# Patient Record
Sex: Male | Born: 1980 | Race: White | Hispanic: No | Marital: Single | Smoking: Never smoker
Health system: Southern US, Community
[De-identification: ages and names within clinical notes are randomized; demographics above are authoritative.]

## PROBLEM LIST (undated history)

## (undated) DIAGNOSIS — K219 Gastro-esophageal reflux disease without esophagitis: Secondary | ICD-10-CM

## (undated) DIAGNOSIS — I10 Essential (primary) hypertension: Secondary | ICD-10-CM

## (undated) DIAGNOSIS — F32A Depression, unspecified: Secondary | ICD-10-CM

## (undated) DIAGNOSIS — R7303 Prediabetes: Secondary | ICD-10-CM

## (undated) DIAGNOSIS — F329 Major depressive disorder, single episode, unspecified: Secondary | ICD-10-CM

## (undated) DIAGNOSIS — E119 Type 2 diabetes mellitus without complications: Secondary | ICD-10-CM

## (undated) DIAGNOSIS — G43909 Migraine, unspecified, not intractable, without status migrainosus: Secondary | ICD-10-CM

## (undated) DIAGNOSIS — N289 Disorder of kidney and ureter, unspecified: Secondary | ICD-10-CM

## (undated) DIAGNOSIS — F419 Anxiety disorder, unspecified: Secondary | ICD-10-CM

## (undated) HISTORY — PX: HERNIA REPAIR: SHX51

## (undated) HISTORY — DX: Migraine, unspecified, not intractable, without status migrainosus: G43.909

## (undated) HISTORY — PX: HAND SURGERY: SHX662

## (undated) HISTORY — PX: KNEE SURGERY: SHX244

---

## 2013-06-21 ENCOUNTER — Inpatient Hospital Stay (HOSPITAL_COMMUNITY)
Admission: EM | Admit: 2013-06-21 | Discharge: 2013-06-22 | DRG: 392 | Disposition: A | Discharge status: Home / Self Care | Payer: PRIVATE HEALTH INSURANCE | Attending: Family Medicine | Admitting: Family Medicine

## 2013-06-21 ENCOUNTER — Encounter (HOSPITAL_COMMUNITY): Payer: Self-pay | Admitting: Emergency Medicine

## 2013-06-21 DIAGNOSIS — R3129 Other microscopic hematuria: Secondary | ICD-10-CM | POA: Diagnosis present

## 2013-06-21 DIAGNOSIS — A088 Other specified intestinal infections: Principal | ICD-10-CM | POA: Diagnosis present

## 2013-06-21 DIAGNOSIS — E871 Hypo-osmolality and hyponatremia: Secondary | ICD-10-CM | POA: Diagnosis present

## 2013-06-21 DIAGNOSIS — R7309 Other abnormal glucose: Secondary | ICD-10-CM | POA: Diagnosis present

## 2013-06-21 DIAGNOSIS — R197 Diarrhea, unspecified: Secondary | ICD-10-CM | POA: Diagnosis present

## 2013-06-21 DIAGNOSIS — R112 Nausea with vomiting, unspecified: Secondary | ICD-10-CM | POA: Diagnosis present

## 2013-06-21 DIAGNOSIS — R109 Unspecified abdominal pain: Secondary | ICD-10-CM | POA: Diagnosis present

## 2013-06-21 DIAGNOSIS — R739 Hyperglycemia, unspecified: Secondary | ICD-10-CM | POA: Diagnosis present

## 2013-06-21 DIAGNOSIS — Z87442 Personal history of urinary calculi: Secondary | ICD-10-CM

## 2013-06-21 LAB — BASIC METABOLIC PANEL
BUN: 11 mg/dL (ref 6–23)
CO2: 25 mEq/L (ref 19–32)
Calcium: 8.4 mg/dL (ref 8.4–10.5)
Chloride: 98 mEq/L (ref 96–112)
Creatinine, Ser: 0.76 mg/dL (ref 0.50–1.35)
Glucose, Bld: 103 mg/dL — ABNORMAL HIGH (ref 70–99)
POTASSIUM: 3.7 meq/L (ref 3.7–5.3)
Sodium: 135 mEq/L — ABNORMAL LOW (ref 137–147)

## 2013-06-21 LAB — URINE MICROSCOPIC-ADD ON

## 2013-06-21 LAB — CBC WITH DIFFERENTIAL/PLATELET
BASOS ABS: 0 10*3/uL (ref 0.0–0.1)
BASOS PCT: 0 % (ref 0–1)
Eosinophils Absolute: 0 10*3/uL (ref 0.0–0.7)
Eosinophils Relative: 0 % (ref 0–5)
HCT: 40 % (ref 39.0–52.0)
Hemoglobin: 14.3 g/dL (ref 13.0–17.0)
LYMPHS PCT: 8 % — AB (ref 12–46)
Lymphs Abs: 0.7 10*3/uL (ref 0.7–4.0)
MCH: 32.4 pg (ref 26.0–34.0)
MCHC: 35.8 g/dL (ref 30.0–36.0)
MCV: 90.5 fL (ref 78.0–100.0)
Monocytes Absolute: 0.7 10*3/uL (ref 0.1–1.0)
Monocytes Relative: 8 % (ref 3–12)
NEUTROS ABS: 7.1 10*3/uL (ref 1.7–7.7)
NEUTROS PCT: 84 % — AB (ref 43–77)
Platelets: 181 10*3/uL (ref 150–400)
RBC: 4.42 MIL/uL (ref 4.22–5.81)
RDW: 12.4 % (ref 11.5–15.5)
WBC: 8.5 10*3/uL (ref 4.0–10.5)

## 2013-06-21 LAB — HEPATIC FUNCTION PANEL
ALK PHOS: 78 U/L (ref 39–117)
ALT: 14 U/L (ref 0–53)
AST: 13 U/L (ref 0–37)
Albumin: 3.6 g/dL (ref 3.5–5.2)
Bilirubin, Direct: <0.2 mg/dL (ref 0.0–0.3)
TOTAL PROTEIN: 7.4 g/dL (ref 6.0–8.3)
Total Bilirubin: 1 mg/dL (ref 0.3–1.2)

## 2013-06-21 LAB — URINALYSIS, ROUTINE W REFLEX MICROSCOPIC
Bilirubin Urine: NEGATIVE
Glucose, UA: NEGATIVE mg/dL
Ketones, ur: NEGATIVE mg/dL
LEUKOCYTES UA: NEGATIVE
NITRITE: NEGATIVE
PROTEIN: NEGATIVE mg/dL
Specific Gravity, Urine: 1.025 (ref 1.005–1.030)
Urobilinogen, UA: 0.2 mg/dL (ref 0.0–1.0)
pH: 5.5 (ref 5.0–8.0)

## 2013-06-21 LAB — LIPASE, BLOOD: LIPASE: 22 U/L (ref 11–59)

## 2013-06-21 MED ORDER — ONDANSETRON HCL 4 MG/2ML IJ SOLN
4.0000 mg | Freq: Once | INTRAMUSCULAR | Status: AC
  Administered 2013-06-21: 4 mg via INTRAVENOUS
  Filled 2013-06-21: qty 2

## 2013-06-21 MED ORDER — HYDROMORPHONE HCL PF 1 MG/ML IJ SOLN
1.0000 mg | Freq: Once | INTRAMUSCULAR | Status: AC
  Administered 2013-06-21: 1 mg via INTRAVENOUS

## 2013-06-21 MED ORDER — HYDROMORPHONE HCL PF 1 MG/ML IJ SOLN
INTRAMUSCULAR | Status: AC
  Filled 2013-06-21: qty 1

## 2013-06-21 MED ORDER — HYDROMORPHONE HCL PF 1 MG/ML IJ SOLN
1.0000 mg | Freq: Once | INTRAMUSCULAR | Status: AC
  Administered 2013-06-21: 1 mg via INTRAVENOUS
  Filled 2013-06-21: qty 1

## 2013-06-21 NOTE — ED Provider Notes (Addendum)
CSN: [id number]     Arrival date & time 06/21/13  1813 History  This chart was scribed for Maudry Diego, MD by Rolanda Lundborg, ED Scribe. This patient was seen in room APA04/APA04 and the patient's care was started at 7:49 PM.    Chief Complaint  Patient presents with  . Abdominal Pain   Patient is a 33 y.o. male presenting with abdominal pain. The history is provided by the patient. No language interpreter was used.  Abdominal Pain Pain location:  RLQ Pain radiates to:  Does not radiate Pain severity:  Moderate Onset quality:  Gradual Duration:  5 hours Timing:  Constant Associated symptoms: nausea and vomiting   Associated symptoms: no chest pain, no cough, no diarrhea, no dysuria, no fatigue and no hematuria    HPI Comments: Matthew Vance is a 33 y.o. male who presents to the Emergency Department complaining of RLQ abdominal pain onset this afternoon with nausea and vomiting. He states the vomiting started at 1PM and the pain started at [facility]. He has not taken anything for the pain. He has not had anything to eat or drink in 4 hours. He denies h/o similar pain, abdominal surgeries. He denies back pain, dysuria.  PCP - Gar Ponto  History reviewed. No pertinent past medical history. History reviewed. No pertinent past surgical history. No family history on file. History  Substance Use Topics  . Smoking status: Never Smoker   . Smokeless tobacco: Not on file  . Alcohol Use: Yes    Review of Systems  Constitutional: Negative for appetite change and fatigue.  HENT: Negative for congestion, ear discharge and sinus pressure.   Eyes: Negative for discharge.  Respiratory: Negative for cough.   Cardiovascular: Negative for chest pain.  Gastrointestinal: Positive for nausea, vomiting and abdominal pain. Negative for diarrhea.  Genitourinary: Negative for dysuria, frequency and hematuria.  Musculoskeletal: Negative for back pain.  Skin: Negative for rash.  Neurological: Negative  for seizures and headaches.  Psychiatric/Behavioral: Negative for hallucinations.      Allergies  Review of patient's allergies indicates no known allergies.  Home Medications   Current Outpatient Rx  Name  Route  Sig  Dispense  Refill  . FLUoxetine (PROZAC) 20 MG capsule   Oral   Take 20 mg by mouth at bedtime.         . TRAZODONE HCL PO   Oral   Take 2 tablets by mouth at bedtime.          BP 142/91  Pulse 64  Temp(Src) 99.1 F (37.3 C) (Oral)  Resp 20  Ht 6\' 4"  (1.93 m)  Wt 290 lb (131.543 kg)  BMI 35.31 kg/m2  SpO2 95% Physical Exam  Constitutional: He is oriented to person, place, and time. He appears well-developed.  HENT:  Head: Normocephalic.  Eyes: Conjunctivae and EOM are normal. No scleral icterus.  Neck: Neck supple. No thyromegaly present.  Cardiovascular: Normal rate and regular rhythm.  Exam reveals no gallop and no friction rub.   No murmur heard. Pulmonary/Chest: No stridor. He has no wheezes. He has no rales. He exhibits no tenderness.  Abdominal: He exhibits no distension. There is tenderness (moderate RLQ and suprapubic). There is no rebound.  Musculoskeletal: Normal range of motion. He exhibits no edema.  Lymphadenopathy:    He has no cervical adenopathy.  Neurological: He is oriented to person, place, and time. He exhibits normal muscle tone. Coordination normal.  Skin: No rash noted. No erythema.  Psychiatric: He  has a normal mood and affect. His behavior is normal.    ED Course  Procedures (including critical care time) Medications  HYDROmorphone (DILAUDID) injection 1 mg (1 mg Intravenous Given 06/21/13 2005)  ondansetron (ZOFRAN) injection 4 mg (4 mg Intravenous Given 06/21/13 2005)    DIAGNOSTIC STUDIES: Oxygen Saturation is 95% on RA, normal by my interpretation.    COORDINATION OF CARE: 7:53 PM- Discussed treatment plan with pt which includes UA, blood work, pain management. Pt agrees to plan.    Labs Review Labs Reviewed   BASIC METABOLIC PANEL - Abnormal; Notable for the following:    Sodium 135 (*)    Glucose, Bld 103 (*)    All other components within normal limits  CBC WITH DIFFERENTIAL - Abnormal; Notable for the following:    Neutrophils Relative % 84 (*)    Lymphocytes Relative 8 (*)    All other components within normal limits  LIPASE, BLOOD  HEPATIC FUNCTION PANEL  URINALYSIS, ROUTINE W REFLEX MICROSCOPIC   Imaging Review No results found.   EKG Interpretation None      MDM   Final diagnoses:  None  abdominal pain.  Unknown etiology.  Cbc, cmet, u/a neg.  Ct abd at [facility] today at 1pm normal.  Pt to follow up with pcp tomorrow.  The chart was scribed for me under my direct supervision.  I personally performed the history, physical, and medical decision making and all procedures in the evaluation of this patient.Maudry Diego, MD 06/21/13 2139  Maudry Diego, MD 06/21/13 2230                                         The pt refused discharge.   The hospitalist will obs admit the pt The chart was scribed for me under my direct supervision.  I personally performed the history, physical, and medical decision making and all procedures in the evaluation of this patient.Maudry Diego, MD 06/21/13 [phone]

## 2013-06-21 NOTE — Discharge Instructions (Signed)
Follow up with your md tomorrow for recheck.  Take fluids only

## 2013-06-21 NOTE — ED Notes (Signed)
Lower right quadrant pain with nausea and vomiting

## 2013-06-22 ENCOUNTER — Encounter (HOSPITAL_COMMUNITY): Payer: Self-pay | Admitting: *Deleted

## 2013-06-22 DIAGNOSIS — R739 Hyperglycemia, unspecified: Secondary | ICD-10-CM | POA: Diagnosis present

## 2013-06-22 DIAGNOSIS — R109 Unspecified abdominal pain: Secondary | ICD-10-CM

## 2013-06-22 DIAGNOSIS — R1031 Right lower quadrant pain: Secondary | ICD-10-CM | POA: Diagnosis not present

## 2013-06-22 DIAGNOSIS — R197 Diarrhea, unspecified: Secondary | ICD-10-CM | POA: Diagnosis present

## 2013-06-22 DIAGNOSIS — A088 Other specified intestinal infections: Secondary | ICD-10-CM | POA: Diagnosis not present

## 2013-06-22 DIAGNOSIS — R3129 Other microscopic hematuria: Secondary | ICD-10-CM | POA: Diagnosis present

## 2013-06-22 DIAGNOSIS — E871 Hypo-osmolality and hyponatremia: Secondary | ICD-10-CM | POA: Diagnosis present

## 2013-06-22 DIAGNOSIS — R112 Nausea with vomiting, unspecified: Secondary | ICD-10-CM

## 2013-06-22 DIAGNOSIS — R7309 Other abnormal glucose: Secondary | ICD-10-CM | POA: Diagnosis present

## 2013-06-22 DIAGNOSIS — Z87442 Personal history of urinary calculi: Secondary | ICD-10-CM | POA: Diagnosis not present

## 2013-06-22 LAB — COMPREHENSIVE METABOLIC PANEL
ALT: 13 U/L (ref 0–53)
AST: 14 U/L (ref 0–37)
Albumin: 3.4 g/dL — ABNORMAL LOW (ref 3.5–5.2)
Alkaline Phosphatase: 73 U/L (ref 39–117)
BUN: 12 mg/dL (ref 6–23)
CO2: 24 meq/L (ref 19–32)
Calcium: 8.2 mg/dL — ABNORMAL LOW (ref 8.4–10.5)
Chloride: 101 mEq/L (ref 96–112)
Creatinine, Ser: 0.76 mg/dL (ref 0.50–1.35)
GFR calc Af Amer: >90 mL/min (ref >90)
Glucose, Bld: 111 mg/dL — ABNORMAL HIGH (ref 70–99)
Potassium: 3.8 mEq/L (ref 3.7–5.3)
Sodium: 136 mEq/L — ABNORMAL LOW (ref 137–147)
Total Bilirubin: 0.8 mg/dL (ref 0.3–1.2)
Total Protein: 7 g/dL (ref 6.0–8.3)

## 2013-06-22 LAB — HEMOGLOBIN A1C
Hgb A1c MFr Bld: 5.1 % (ref <5.7)
MEAN PLASMA GLUCOSE: 100 mg/dL (ref <117)

## 2013-06-22 LAB — CBC WITH DIFFERENTIAL/PLATELET
Basophils Absolute: 0 10*3/uL (ref 0.0–0.1)
Basophils Relative: 0 % (ref 0–1)
Eosinophils Absolute: 0 10*3/uL (ref 0.0–0.7)
Eosinophils Relative: 1 % (ref 0–5)
HCT: 38.6 % — ABNORMAL LOW (ref 39.0–52.0)
Hemoglobin: 13.4 g/dL (ref 13.0–17.0)
LYMPHS ABS: 0.9 10*3/uL (ref 0.7–4.0)
Lymphocytes Relative: 16 % (ref 12–46)
MCH: 31.7 pg (ref 26.0–34.0)
MCHC: 34.7 g/dL (ref 30.0–36.0)
MCV: 91.3 fL (ref 78.0–100.0)
MONOS PCT: 12 % (ref 3–12)
Monocytes Absolute: 0.7 10*3/uL (ref 0.1–1.0)
NEUTROS ABS: 4 10*3/uL (ref 1.7–7.7)
NEUTROS PCT: 72 % (ref 43–77)
Platelets: 165 10*3/uL (ref 150–400)
RBC: 4.23 MIL/uL (ref 4.22–5.81)
RDW: 12.6 % (ref 11.5–15.5)
WBC: 5.6 10*3/uL (ref 4.0–10.5)

## 2013-06-22 LAB — CLOSTRIDIUM DIFFICILE BY PCR: CDIFFPCR: NEGATIVE

## 2013-06-22 MED ORDER — CIPROFLOXACIN IN D5W 400 MG/200ML IV SOLN
400.0000 mg | Freq: Two times a day (BID) | INTRAVENOUS | Status: DC
  Administered 2013-06-22: 400 mg via INTRAVENOUS
  Filled 2013-06-22: qty 200

## 2013-06-22 MED ORDER — ONDANSETRON HCL 4 MG/2ML IJ SOLN: 4.0000 mg | Freq: Four times a day (QID) | INTRAMUSCULAR | Status: DC | PRN

## 2013-06-22 MED ORDER — FLUOXETINE HCL 20 MG PO CAPS
20.0000 mg | ORAL_CAPSULE | Freq: Every day | ORAL | Status: DC
  Administered 2013-06-22: 20 mg via ORAL
  Filled 2013-06-22 (×3): qty 1

## 2013-06-22 MED ORDER — SODIUM CHLORIDE 0.9 % IV SOLN
INTRAVENOUS | Status: DC
  Administered 2013-06-22: 09:00:00 via INTRAVENOUS

## 2013-06-22 MED ORDER — HYDROMORPHONE HCL PF 1 MG/ML IJ SOLN
1.0000 mg | INTRAMUSCULAR | Status: DC | PRN
  Administered 2013-06-22: 1 mg via INTRAVENOUS
  Filled 2013-06-22: qty 1

## 2013-06-22 MED ORDER — HYDROMORPHONE HCL PF 1 MG/ML IJ SOLN
INTRAMUSCULAR | Status: AC
  Administered 2013-06-22: 1 mg
  Filled 2013-06-22: qty 1

## 2013-06-22 MED ORDER — ONDANSETRON HCL 4 MG PO TABS: 4.0000 mg | ORAL_TABLET | ORAL | Status: DC | PRN

## 2013-06-22 MED ORDER — HYDROMORPHONE HCL PF 1 MG/ML IJ SOLN
1.0000 mg | INTRAMUSCULAR | Status: DC | PRN
  Administered 2013-06-22 (×3): 1 mg via INTRAVENOUS
  Filled 2013-06-22 (×4): qty 1

## 2013-06-22 MED ORDER — BOOST / RESOURCE BREEZE PO LIQD
1.0000 | Freq: Two times a day (BID) | ORAL | Status: DC
  Administered 2013-06-22: 1 via ORAL

## 2013-06-22 MED ORDER — HEPARIN SODIUM (PORCINE) 5000 UNIT/ML IJ SOLN
5000.0000 [IU] | Freq: Three times a day (TID) | INTRAMUSCULAR | Status: DC
  Administered 2013-06-22 (×3): 5000 [IU] via SUBCUTANEOUS
  Filled 2013-06-22 (×4): qty 1

## 2013-06-22 MED ORDER — ONDANSETRON HCL 4 MG/2ML IJ SOLN
4.0000 mg | INTRAMUSCULAR | Status: DC | PRN
  Administered 2013-06-22: 4 mg via INTRAVENOUS
  Filled 2013-06-22: qty 2

## 2013-06-22 MED ORDER — ONDANSETRON HCL 4 MG/2ML IJ SOLN
4.0000 mg | Freq: Four times a day (QID) | INTRAMUSCULAR | Status: DC | PRN
  Administered 2013-06-22: 4 mg via INTRAVENOUS
  Filled 2013-06-22: qty 2

## 2013-06-22 MED ORDER — TRAZODONE HCL 50 MG PO TABS
50.0000 mg | ORAL_TABLET | Freq: Every day | ORAL | Status: DC
  Administered 2013-06-22: 50 mg via ORAL
  Filled 2013-06-22: qty 1

## 2013-06-22 MED ORDER — ONDANSETRON HCL 4 MG PO TABS: 4.0000 mg | ORAL_TABLET | Freq: Four times a day (QID) | ORAL | Status: DC | PRN

## 2013-06-22 MED ORDER — PROMETHAZINE HCL 12.5 MG PO TABS: 12.5000 mg | ORAL_TABLET | Freq: Two times a day (BID) | ORAL | Status: DC | PRN

## 2013-06-22 NOTE — Progress Notes (Signed)
Late entry:  Patient reported feeling itchy after Cipro infusion.  Some redness to back and chest.  No difficult breathing.  VS see flowsheet.  Dyanne Carrel, NP notified.

## 2013-06-22 NOTE — Discharge Summary (Signed)
Physician Discharge Summary  Braxon Suder LPF:[id number] DOB: 1981-03-17 DOA: 06/21/2013  PCP: Gar Ponto, MD  Admit date: 06/21/2013 Discharge date: 06/22/2013  Recommendations for Outpatient Follow-up:  1. Resolution of abdominal pain, vomiting, diarrhea.   Follow-up Information   Follow up with Gar Ponto, MD In 1 week.   Specialty:  Family Medicine   Contact information:   [place] [place] [address] [phone]      Discharge Diagnoses:  1. Right lower quadrant pain of unclear etiology, likely related to viral gastroenteritis. 2. Nausea, vomiting, diarrhea, suspected viral gastroenteritis.  Discharge Condition: Improved Disposition: Home  Diet recommendation: Regular  Filed Weights   06/21/13 1839 06/22/13 0058  Weight: 131.543 kg (290 lb) 141.2 kg (311 lb 4.6 oz)    History of present illness:  33 year old man with no significant past medical history, was seen in the ER at [facility] 3/5 and according to record review BUN, creatinine, hemoglobin, hematocrit were unremarkable, normal platelet count. White blood cell count 13.2. Urinalysis was unremarkable. CT of the abdomen and pelvis with contrast report: No urolithiasis or obstructive uropathy. Normal appendix. Small fat containing umbilical hernia. He presented later the same day to [facility] ED with complaints of persistent right lower quadrant pain at which time basic metabolic panel, hepatic function panel, CBC, urinalysis were unremarkable. C. difficile was negative. Discharge home was recommended but the patient refused and therefore observation was requested.  Hospital Course:  Patient was observed overnight, hydrated with IV fluids and treated with supportive care. He is seen in consultation with surgery.  Right lower quadrant abdominal pain has nearly resolved at this point. Negative CT of the abdomen and pelvis at outside facility, no evidence of intra-abdominal infection or nephrolithiasis. Laboratory workup  unremarkable.   He has been seen in consultation by general surgery, impression was right lower quadrant pain of unclear etiology, consider typhlitis. Empiric ciprofloxacin was recommended. Appendicitis or other surgical problem was doubted.  Patient has no fever, no leukopenia, no neutropenic and no findings on CT. Complete metabolic panel unremarkable. He has received no antibiotics until late this afternoon, prior to that white blood cell count was normal and again he has been afebrile. Stool studies are pending but there is no evidence to suggest bacterial infection at this point. Plan to discontinue antibiotics.   Plan discharge home. I gave him strict instructions to return to emergency Department or see his primary care physician for recurrent symptoms. He and his mother understand the results of all his laboratory testing and imaging and that no significant acute abnormalities have been discovered at this time but that illness can evolve over time.  Discharge Instructions  Discharge Orders   Future Orders Complete By Expires   Activity as tolerated - No restrictions  As directed    Diet general  As directed    Discharge instructions  As directed    Comments:     Call your physician or seek immediate medical assistance for fever, recurrent abdominal pain, vomiting or diarrhea.       Medication List         FLUoxetine 20 MG capsule  Commonly known as:  PROZAC  Take 20 mg by mouth at bedtime.     TRAZODONE HCL PO  Take 2 tablets by mouth at bedtime.       No Known Allergies  The results of significant diagnostics from this hospitalization (including imaging, microbiology, ancillary and laboratory) are listed below for reference.    Significant Diagnostic  Studies: No results found.  Microbiology: Recent Results (from the past 240 hour(s))  CLOSTRIDIUM DIFFICILE BY PCR     Status: None   Collection Time    06/22/13  1:30 PM      Result Value Ref Range Status   C  difficile by pcr NEGATIVE  NEGATIVE Final     Labs: Basic Metabolic Panel:  Recent Labs Lab 06/21/13 1936 06/22/13 0515  NA 135* 136*  K 3.7 3.8  CL 98 101  CO2 25 24  GLUCOSE 103* 111*  BUN 11 12  CREATININE 0.76 0.76  CALCIUM 8.4 8.2*   Liver Function Tests:  Recent Labs Lab 06/21/13 1930 06/22/13 0515  AST 13 14  ALT 14 13  ALKPHOS 78 73  BILITOT 1.0 0.8  PROT 7.4 7.0  ALBUMIN 3.6 3.4*    Recent Labs Lab 06/21/13 1930  LIPASE 22   CBC:  Recent Labs Lab 06/21/13 1936 06/22/13 0515  WBC 8.5 5.6  NEUTROABS 7.1 4.0  HGB 14.3 13.4  HCT 40.0 38.6*  MCV 90.5 91.3  PLT 181 165    Active Problems:   Abdominal pain   Hyponatremia   Hyperglycemia   Nausea with vomiting   Hematuria, microscopic   Diarrhea   Time coordinating discharge: 35 minutes  Signed:  Murray Hodgkins, MD [facility] 06/22/2013, 5:37 PM

## 2013-06-22 NOTE — Progress Notes (Signed)
Patient seen, independently examined and chart reviewed. I agree with exam, assessment and plan discussed with Dyanne Carrel, NP.  33 year old man with no significant past medical history, was seen in the ER at [facility] 3/5 and according to record review BUN, creatinine, hemoglobin, hematocrit were unremarkable, normal platelet count. White blood cell count 13.2. Urinalysis was unremarkable. CT of the abdomen and pelvis with contrast report: No urolithiasis or obstructive uropathy. Normal appendix. Small fat containing umbilical hernia.  He presented later the same day to [facility] ED with complaints of persistent right lower quadrant pain at which time basic metabolic panel, hepatic function panel, CBC, urinalysis were unremarkable. C. difficile was negative. Discharge home was recommended but the patient refused and therefore observation was requested.  Subjective: He feels remarkably better now. He has minimal right lower quadrant pain. No nausea, vomiting or diarrhea now. Tolerating liquids. He strongly requests to be discharged home. Mother at bedside agrees he is much improved now and like to be discharged.  Objective: Afebrile, vital signs are stable. No hypoxia. He appears calm and comfortable. Well-appearing. Moves without apparent pain. Cardiovascular regular rate and rhythm. Respiratory clear to auscultation bilaterally. No wheezes, rales or rhonchi. Normal respiratory effort. Abdomen soft, nondistended. He has modest right lower quadrant pain with palpation without rebound.  Complete metabolic panel unremarkable. CBC unremarkable with normal white blood cell count and differential.  Assessment/plan Right lower quadrant abdominal pain. Negative CT of the abdomen and pelvis at outside facility, no evidence of intra-abdominal infection or nephrolithiasis. Laboratory workup unremarkable.  He has been seen in consultation by general surgery, impression was right lower quadrant pain of unclear  etiology, consider typhlitis. Empiric ciprofloxacin was recommended. Appendicitis or other surgical problem was gout at.  Patient has no fever, no leukopenia, no neutropenic and no findings on CT. Complete metabolic panel unremarkable. He has received no antibiotics until late this afternoon, prior to that white blood cell count was normal and again he has been afebrile. Stool studies are pending but there is no evidence to suggest bacterial infection at this point. Plan to discontinue antibiotics.  Plan discharge home. I gave him strict instructions to return to emergency Department or see his primary care physician for recurrent symptoms. He and his mother understand the results of all his laboratory testing and imaging and that no acute abnormalities have been discovered at this time but that illness can evolve over time.  Murray Hodgkins, MD [facility] [phone]

## 2013-06-22 NOTE — Progress Notes (Signed)
Late Entry 7741:  Patient complained of nausea.  Zofran PRN not due until later this afternoon.  Dyanne Carrel, NP notified via text page.

## 2013-06-22 NOTE — Consult Note (Signed)
Reason for Consult: Right lower quadrant abdominal pain Referring Physician: [facility]  Matthew Vance is an 33 y.o. male.  HPI: Patient is a 33 year old white male who presented to emergency room at [facility] with right lower quadrant abdominal pain. Workup including CT scan the abdomen was unremarkable. He did not have evidence of acute appendicitis. Due to persistence of the pain, he then presented to [facility]. He states that he has started to have diarrhea and loose watery stools. 2 of his children have had similar abdominal pain and diarrhea. He denies any fever chills. He has never had an episode like this before.  History reviewed. No pertinent past medical history.  History reviewed. No pertinent past surgical history.  No family history on file.  Social History:  reports that he has never smoked. He does not have any smokeless tobacco history on file. He reports that he drinks alcohol. His drug history is not on file.  Allergies: No Known Allergies  Medications: I have reviewed the patient's current medications.  Results for orders placed during the hospital encounter of 06/21/13 (from the past 48 hour(s))  LIPASE, BLOOD     Status: None   Collection Time    06/21/13  7:30 PM      Result Value Ref Range   Lipase 22  11 - 59 U/L  HEPATIC FUNCTION PANEL     Status: None   Collection Time    06/21/13  7:30 PM      Result Value Ref Range   Total Protein 7.4  6.0 - 8.3 g/dL   Albumin 3.6  3.5 - 5.2 g/dL   AST 13  0 - 37 U/L   ALT 14  0 - 53 U/L   Alkaline Phosphatase 78  39 - 117 U/L   Total Bilirubin 1.0  0.3 - 1.2 mg/dL   Bilirubin, Direct <0.2  0.0 - 0.3 mg/dL   Indirect Bilirubin NOT CALCULATED  0.3 - 0.9 mg/dL  BASIC METABOLIC PANEL     Status: Abnormal   Collection Time    06/21/13  7:36 PM      Result Value Ref Range   Sodium 135 (*) 137 - 147 mEq/L   Potassium 3.7  3.7 - 5.3 mEq/L   Chloride 98  96 - 112 mEq/L   CO2 25  19 -  32 mEq/L   Glucose, Bld 103 (*) 70 - 99 mg/dL   BUN 11  6 - 23 mg/dL   Creatinine, Ser 0.76  0.50 - 1.35 mg/dL   Calcium 8.4  8.4 - 10.5 mg/dL   GFR calc non Af Amer >90  >90 mL/min   GFR calc Af Amer >90  >90 mL/min   Comment: (NOTE)     The eGFR has been calculated using the CKD EPI equation.     This calculation has not been validated in all clinical situations.     eGFR's persistently <90 mL/min signify possible Chronic Kidney     Disease.  CBC WITH DIFFERENTIAL     Status: Abnormal   Collection Time    06/21/13  7:36 PM      Result Value Ref Range   WBC 8.5  4.0 - 10.5 K/uL   RBC 4.42  4.22 - 5.81 MIL/uL   Hemoglobin 14.3  13.0 - 17.0 g/dL   HCT 40.0  39.0 - 52.0 %   MCV 90.5  78.0 - 100.0 fL   MCH 32.4  26.0 - 34.0 pg  MCHC 35.8  30.0 - 36.0 g/dL   RDW 12.4  11.5 - 15.5 %   Platelets 181  150 - 400 K/uL   Neutrophils Relative % 84 (*) 43 - 77 %   Neutro Abs 7.1  1.7 - 7.7 K/uL   Lymphocytes Relative 8 (*) 12 - 46 %   Lymphs Abs 0.7  0.7 - 4.0 K/uL   Monocytes Relative 8  3 - 12 %   Monocytes Absolute 0.7  0.1 - 1.0 K/uL   Eosinophils Relative 0  0 - 5 %   Eosinophils Absolute 0.0  0.0 - 0.7 K/uL   Basophils Relative 0  0 - 1 %   Basophils Absolute 0.0  0.0 - 0.1 K/uL  URINALYSIS, ROUTINE W REFLEX MICROSCOPIC     Status: Abnormal   Collection Time    06/21/13  8:10 PM      Result Value Ref Range   Color, Urine YELLOW  YELLOW   APPearance CLEAR  CLEAR   Specific Gravity, Urine 1.025  1.005 - 1.030   pH 5.5  5.0 - 8.0   Glucose, UA NEGATIVE  NEGATIVE mg/dL   Hgb urine dipstick TRACE (*) NEGATIVE   Bilirubin Urine NEGATIVE  NEGATIVE   Ketones, ur NEGATIVE  NEGATIVE mg/dL   Protein, ur NEGATIVE  NEGATIVE mg/dL   Urobilinogen, UA 0.2  0.0 - 1.0 mg/dL   Nitrite NEGATIVE  NEGATIVE   Leukocytes, UA NEGATIVE  NEGATIVE  URINE MICROSCOPIC-ADD ON     Status: None   Collection Time    06/21/13  8:10 PM      Result Value Ref Range   Squamous Epithelial / LPF RARE  RARE    WBC, UA 0-2  <3 WBC/hpf   RBC / HPF 0-2  <3 RBC/hpf   Bacteria, UA RARE  RARE  COMPREHENSIVE METABOLIC PANEL     Status: Abnormal   Collection Time    06/22/13  5:15 AM      Result Value Ref Range   Sodium 136 (*) 137 - 147 mEq/L   Potassium 3.8  3.7 - 5.3 mEq/L   Chloride 101  96 - 112 mEq/L   CO2 24  19 - 32 mEq/L   Glucose, Bld 111 (*) 70 - 99 mg/dL   BUN 12  6 - 23 mg/dL   Creatinine, Ser 0.76  0.50 - 1.35 mg/dL   Calcium 8.2 (*) 8.4 - 10.5 mg/dL   Total Protein 7.0  6.0 - 8.3 g/dL   Albumin 3.4 (*) 3.5 - 5.2 g/dL   AST 14  0 - 37 U/L   ALT 13  0 - 53 U/L   Alkaline Phosphatase 73  39 - 117 U/L   Total Bilirubin 0.8  0.3 - 1.2 mg/dL   GFR calc non Af Amer >90  >90 mL/min   GFR calc Af Amer >90  >90 mL/min   Comment: (NOTE)     The eGFR has been calculated using the CKD EPI equation.     This calculation has not been validated in all clinical situations.     eGFR's persistently <90 mL/min signify possible Chronic Kidney     Disease.  CBC WITH DIFFERENTIAL     Status: Abnormal   Collection Time    06/22/13  5:15 AM      Result Value Ref Range   WBC 5.6  4.0 - 10.5 K/uL   RBC 4.23  4.22 - 5.81 MIL/uL   Hemoglobin 13.4  13.0 -  17.0 g/dL   HCT 38.6 (*) 39.0 - 52.0 %   MCV 91.3  78.0 - 100.0 fL   MCH 31.7  26.0 - 34.0 pg   MCHC 34.7  30.0 - 36.0 g/dL   RDW 12.6  11.5 - 15.5 %   Platelets 165  150 - 400 K/uL   Neutrophils Relative % 72  43 - 77 %   Neutro Abs 4.0  1.7 - 7.7 K/uL   Lymphocytes Relative 16  12 - 46 %   Lymphs Abs 0.9  0.7 - 4.0 K/uL   Monocytes Relative 12  3 - 12 %   Monocytes Absolute 0.7  0.1 - 1.0 K/uL   Eosinophils Relative 1  0 - 5 %   Eosinophils Absolute 0.0  0.0 - 0.7 K/uL   Basophils Relative 0  0 - 1 %   Basophils Absolute 0.0  0.0 - 0.1 K/uL    No results found.  ROS: See chart Blood pressure 135/89, pulse 65, temperature 97.8 F (36.6 C), temperature source Oral, resp. rate 20, height 6' 4"  (1.93 m), weight 141.2 kg (311 lb 4.6 oz),  SpO2 96.00%. Physical Exam: Obese white male who states he feels nauseated. Abdomen was soft and not rigid. He does have right lower quadrant abdominal pain as well as right upper quadrant abdominal pain to palpation. No hepatosplenomegaly or masses are noted. Rectal examination was deferred at this time.  Assessment/Plan: Impression: Right lower quadrant abdominal pain of unknown etiology, favor infectious source of pain such as typhlitis. Plan: Would start patient on ciprofloxacin once stool cultures obtained. No need for acute surgical intervention at this time. Will continue to follow with you. Doubt he has appendicitis or other surgical problem at this time.  Loneta Tamplin A 06/22/2013, 9:10 AM

## 2013-06-22 NOTE — Progress Notes (Signed)
[facility] PROGRESS NOTE  Matthew Vance ZOX:[id number] DOB: April 30, 1980 DOA: 06/21/2013 PCP: Gar Ponto, MD  Assessment/Plan: 1. Abdominal pain: RLQ. Lab work and CT from yesterday at [facility] reviewed and unremarkable. Pain persists. Evaluated by general surgery who favors infectious source. Surgery doubts appendicitis or other surgical problem at this time. He is afebrile a normal white count. Has developed loose stool this am. Reports family members with same. Will obtain stool studies and then start cipro per surgery recommendation. Provide pain medication as needed.  2. Diarrhea: see above. Will obtain stool studies. Monitor electrolytes. Continue clear liquid diet and IV fluids.  3. Nausea/vomiting. Likely related to #1. Will continue clear liquid. Provide zofran as needed. Monitor electrolytes 4. Hyperglycemia: mild. Will check a1c. May need OP follow up. No hx DM 5. Hematuria: microscopic. ?hx kidney stones. Urine output adequate. No flank tenderness. OP follow  Code Status: full Family Communication: mother at bedside Disposition Plan: home hopefully 24-48 hours   Consultants:  General surgery  Procedures:  none  Antibiotics:  cipro 06/22/13>>>  HPI/Subjective: Sitting up in bed visiting with family. Reports continued RLQ "tenderness". Reports nausea and some vomiting after drinking coke.   Objective: Filed Vitals:   06/22/13 0449  BP: 135/89  Pulse: 65  Temp: 97.8 F (36.6 C)  Resp: 20    Intake/Output Summary (Last 24 hours) at 06/22/13 1102 Last data filed at 06/22/13 0932  Gross per 24 hour  Intake  512.5 ml  Output    600 ml  Net  -87.5 ml   Filed Weights   06/21/13 1839 06/22/13 0058  Weight: 131.543 kg (290 lb) 141.2 kg (311 lb 4.6 oz)    Exam:   General:  Obese appears comfortable  Cardiovascular: RRR no m/g/r No LE edema  Respiratory: BS clear to ausculation bilaterally no wheeze  Abdomen: obese soft +BS moderate  tenderness RLQ.   Musculoskeletal: good muscle tone   Data Reviewed: Basic Metabolic Panel:  Recent Labs Lab 06/21/13 1936 06/22/13 0515  NA 135* 136*  K 3.7 3.8  CL 98 101  CO2 25 24  GLUCOSE 103* 111*  BUN 11 12  CREATININE 0.76 0.76  CALCIUM 8.4 8.2*   Liver Function Tests:  Recent Labs Lab 06/21/13 1930 06/22/13 0515  AST 13 14  ALT 14 13  ALKPHOS 78 73  BILITOT 1.0 0.8  PROT 7.4 7.0  ALBUMIN 3.6 3.4*    Recent Labs Lab 06/21/13 1930  LIPASE 22   No results found for this basename: AMMONIA,  in the last 168 hours CBC:  Recent Labs Lab 06/21/13 1936 06/22/13 0515  WBC 8.5 5.6  NEUTROABS 7.1 4.0  HGB 14.3 13.4  HCT 40.0 38.6*  MCV 90.5 91.3  PLT 181 165   Cardiac Enzymes: No results found for this basename: CKTOTAL, CKMB, CKMBINDEX, TROPONINI,  in the last 168 hours BNP (last 3 results) No results found for this basename: PROBNP,  in the last 8760 hours CBG: No results found for this basename: GLUCAP,  in the last 168 hours  No results found for this or any previous visit (from the past 240 hour(s)).   Studies: No results found.  Scheduled Meds: . ciprofloxacin  400 mg Intravenous Q12H  . FLUoxetine  20 mg Oral QHS  . heparin  5,000 Units Subcutaneous 3 times per day  . traZODone  50 mg Oral QHS   Continuous Infusions: . sodium chloride 125 mL/hr at 06/22/13 8119    Active Problems:  Abdominal pain   Hyponatremia   Hyperglycemia   Nausea with vomiting   Hematuria, microscopic   Diarrhea    Time spent: 30 minutes    [facility] Pager [phone]. If 7PM-7AM, please contact night-coverage at [email or web address], password [facility] 06/22/2013, 11:02 AM  LOS: 1 day

## 2013-06-22 NOTE — Progress Notes (Signed)
UR completed. Patient changed to inpatient- requiring IVF @ 125cc/hr and IV antibiotics and IV pain medication

## 2013-06-22 NOTE — Progress Notes (Signed)
INITIAL NUTRITION ASSESSMENT  DOCUMENTATION CODES Per approved criteria  -Obesity Unspecified   INTERVENTION: Follow for diet advancement Resource Breeze po BID, each supplement provides 250 kcal and 9 grams of protein  NUTRITION DIAGNOSIS: Inadequate oral intake related to altered GI function as evidenced by n/v, Po: 0%.   Goal: Pt will meet >90% of estimated nutritional needs  Monitor:  Diet advancement, PO intake, labs, weight changes, skin assessments, I/O's  Reason for Assessment: MST=4  33 y.o. male  Admitting Dx: <principal problem not specified>  ASSESSMENT: Pt admitted for abdominal pain. Pt is currently on a clear liquid diet, with poor tolerance. Pt with n/v after consuming liquids. Noted PO: 0%.  Good appetite PTA. Pt unavailable for hx or exam at time of visit. Due to poor tolerance of diet, pt would benefit from nutritional supplement to support adequate intake during hospitalization.  Height: Ht Readings from Last 1 Encounters:  06/22/13 6\' 4"  (1.93 m)    Weight: Wt Readings from Last 1 Encounters:  06/22/13 311 lb 4.6 oz (141.2 kg)    Ideal Body Weight: 202%  % Ideal Body Weight: 154%  Wt Readings from Last 10 Encounters:  06/22/13 311 lb 4.6 oz (141.2 kg)    Usual Body Weight: 311#  % Usual Body Weight: 100%  BMI:  Body mass index is 37.91 kg/(m^2). Meets criteria for obesity, class II.  Estimated Nutritional Needs: Kcal: 2400-2500 daily Protein: 113-141 grams daily Fluid: 2.4-2.5 L daily  Skin: no issues noted  Diet Order: Clear Liquid  EDUCATION NEEDS: -Education not appropriate at this time   Intake/Output Summary (Last 24 hours) at 06/22/13 1531 Last data filed at 06/22/13 1327  Gross per 24 hour  Intake  632.5 ml  Output    600 ml  Net   32.5 ml    Last BM: 06/21/13   Labs:   Recent Labs Lab 06/21/13 1936 06/22/13 0515  NA 135* 136*  K 3.7 3.8  CL 98 101  CO2 25 24  BUN 11 12  CREATININE 0.76 0.76  CALCIUM  8.4 8.2*  GLUCOSE 103* 111*    CBG (last 3)  No results found for this basename: GLUCAP,  in the last 72 hours  Scheduled Meds: . ciprofloxacin  400 mg Intravenous Q12H  . FLUoxetine  20 mg Oral QHS  . heparin  5,000 Units Subcutaneous 3 times per day  . traZODone  50 mg Oral QHS    Continuous Infusions: . sodium chloride 125 mL/hr at 06/22/13 8921    History reviewed. No pertinent past medical history.  History reviewed. No pertinent past surgical history.  Deslyn Cavenaugh A. Jimmye Norman, RD, LDN Pager: [phone]

## 2013-06-22 NOTE — Progress Notes (Signed)
Nun's hat placed in bathroom for stool specimen.  Patient educated on need for stool specimen.  Patient verbalized understanding.

## 2013-06-22 NOTE — H&P (Addendum)
Hospitalist Admission History and Physical  Patient name: Matthew Vance Medical record number: [id number] Date of birth: 26-May-1980 Age: 33 y.o. Gender: male  Primary Care Provider: Gar Ponto, MD  Chief Complaint: abdominal pain  History of Present Illness:This is a 33 y.o. year old male with no significant prior medical history presenting with one-day history of abdominal pain. Per the patient and family, patient woke up this morning with persistent right lower quadrant abdominal pain. Patient was seen at [facility] at the time. Lower was obtained and is essentially within normal limits. Mom does report patient had a white count of around 13. A CT of the abdomen and pelvis with IV contrast was also obtained that was negative for any acute intra-abdominal pathology. Patient was discharged with a working diagnosis of abdominal strain. Mom states the pain progressively worsened after leaving the evening Hospital. 4 patient here for further evaluation. Imaging was reviewed by emergency physician. No repeat imaging done. Below work including lipase, hepatic function panel, BMET, CBC all WNL. Trace blood on UA.  mom does report a prior history of kidney stones in the patient as well as with the family. Pain is more significant in comparison to previous kidney stones. Patient also reports some mild episodes of diarrhea x2 today. Nonbilious nonbloody.    Patient Active Problem List   Diagnosis Date Noted  . Abdominal pain 06/22/2013   Past Medical History: History reviewed. No pertinent past medical history.  Past Surgical History: History reviewed. No pertinent past surgical history.  Social History: History   Social History  . Marital Status: Single    Spouse Name: N/A    Number of Children: N/A  . Years of Education: N/A   Social History Main Topics  . Smoking status: Never Smoker   . Smokeless tobacco: None  . Alcohol Use: Yes  . Drug Use: None  . Sexual Activity: None    Other Topics Concern  . None   Social History Narrative  . None    Family History: No family history on file.  Allergies: No Known Allergies  Current Facility-Administered Medications  Medication Dose Route Frequency Provider Last Rate Last Dose  . 0.9 %  sodium chloride infusion   Intravenous Continuous Shanda Howells, MD      . FLUoxetine (PROZAC) capsule 20 mg  20 mg Oral QHS Shanda Howells, MD      . heparin injection 5,000 Units  5,000 Units Subcutaneous 3 times per day Shanda Howells, MD      . ondansetron [facility]) tablet 4 mg  4 mg Oral Q6H PRN Shanda Howells, MD       Or  . ondansetron [facility]) injection 4 mg  4 mg Intravenous Q6H PRN Shanda Howells, MD      . traZODone (DESYREL) tablet 50 mg  50 mg Oral QHS Shanda Howells, MD       Current Outpatient Prescriptions  Medication Sig Dispense Refill  . FLUoxetine (PROZAC) 20 MG capsule Take 20 mg by mouth at bedtime.      . TRAZODONE HCL PO Take 2 tablets by mouth at bedtime.       Review Of Systems: 12 point ROS negative except as noted above in HPI.  Physical Exam: Filed Vitals:   06/21/13 2300  BP: 144/59  Pulse: 82  Temp:   Resp:     General: mild distress HEENT: PERRLA Heart: S1, S2 normal, no murmur, rub or gallop, regular rate and rhythm Lungs: clear to auscultation, no wheezes or  rales and unlabored breathing Abdomen: + bowel sounds, marked RLQ TTP  Extremities: extremities normal, atraumatic, no cyanosis or edema Skin:no rashes Neurology: normal without focal findings  Labs and Imaging: Lab Results  Component Value Date/Time   NA 135* 06/21/2013  7:36 PM   K 3.7 06/21/2013  7:36 PM   CL 98 06/21/2013  7:36 PM   CO2 25 06/21/2013  7:36 PM   BUN 11 06/21/2013  7:36 PM   CREATININE 0.76 06/21/2013  7:36 PM   GLUCOSE 103* 06/21/2013  7:36 PM   Lab Results  Component Value Date   WBC 8.5 06/21/2013   HGB 14.3 06/21/2013   HCT 40.0 06/21/2013   MCV 90.5 06/21/2013   PLT 181 06/21/2013   Urinalysis    Component Value  Date/Time   COLORURINE YELLOW 06/21/2013 2010   Peculiar 06/21/2013 2010   LABSPEC 1.025 06/21/2013 2010   PHURINE 5.5 06/21/2013 2010   GLUCOSEU NEGATIVE 06/21/2013 2010   HGBUR TRACE* 06/21/2013 2010   BILIRUBINUR NEGATIVE 06/21/2013 2010   [place] NEGATIVE 06/21/2013 2010   PROTEINUR NEGATIVE 06/21/2013 2010   UROBILINOGEN 0.2 06/21/2013 2010   NITRITE NEGATIVE 06/21/2013 2010   LEUKOCYTESUR NEGATIVE 06/21/2013 2010       No results found.   Assessment and Plan: Teon Hudnall is a 33 y.o. year old male presenting with  right lower quadrant abdominal pain.   Abdominal pain: Broad differential for symptoms including colitis, appendicitis, kidney stones, musculoskeletal strain. Patient has had a fairly extensive workup within the past 24 hours included a normal CT scan of the abdomen and pelvis with IV contrast, hepatic function, lipase, renal function, white blood cell count. Will obtain stool studies. ? Kidney stone given prior history. Would not be well visualized on CT w/ contrast. Noted trace blood on UA.  No leukocytosis today, though consider starting Cipro Flagyl for GI coverage if patient spikes fever. Prn dilaudid for pain.   FEN/GI: ADAT. Prophylaxis: subq heparin Disposition: pending further evaluation.  Code Status:Full code       Shanda Howells MD  Pager: [phone]

## 2013-06-22 NOTE — Care Management Note (Signed)
    Page 1 of 1   06/22/2013     2:29:58 PM   CARE MANAGEMENT NOTE 06/22/2013  Patient:  Matthew Vance,Matthew Vance   Account Number:  [id number]  Date Initiated:  06/22/2013  Documentation initiated by:  Claretha Cooper  Subjective/Objective Assessment:   Pt from home where he lives with his two daughters. Pt states he has two sisters that are nurses, actually at bedside. No HH needed.     Action/Plan:   Anticipated DC Date:     Anticipated DC Plan:  HOME/SELF CARE      DC Planning Services  CM consult      Choice offered to / List presented to:             Status of service:  Completed, signed off Medicare Important Message given?   (If response is "NO", the following Medicare IM given date fields will be blank) Date Medicare IM given:   Date Additional Medicare IM given:    Discharge Disposition:    Per UR Regulation:    If discussed at Long Length of Stay Meetings, dates discussed:    Comments:  06/22/13 Claretha Cooper RN BSN CM

## 2013-06-23 LAB — GI PATHOGEN PANEL BY PCR, STOOL
C difficile toxin A/B: NEGATIVE
CAMPYLOBACTER BY PCR: NEGATIVE
Cryptosporidium by PCR: NEGATIVE
E COLI 0157 BY PCR: NEGATIVE
E coli (ETEC) LT/ST: NEGATIVE
E coli (STEC): NEGATIVE
G LAMBLIA BY PCR: NEGATIVE
Norovirus GI/GII: NEGATIVE
ROTAVIRUS A BY PCR: NEGATIVE
Salmonella by PCR: NEGATIVE
Shigella by PCR: NEGATIVE

## 2013-06-23 NOTE — Care Management Note (Signed)
UR update to insurance with D/C summary

## 2013-06-26 LAB — STOOL CULTURE

## 2015-06-04 HISTORY — PX: COLONOSCOPY: SHX174

## 2015-06-14 HISTORY — PX: CHOLECYSTECTOMY: SHX55

## 2015-06-27 HISTORY — PX: ESOPHAGOGASTRODUODENOSCOPY: SHX1529

## 2015-09-04 ENCOUNTER — Encounter (HOSPITAL_COMMUNITY): Payer: Self-pay | Admitting: Emergency Medicine

## 2015-09-04 ENCOUNTER — Emergency Department (HOSPITAL_COMMUNITY)
Admission: EM | Admit: 2015-09-04 | Discharge: 2015-09-05 | Disposition: A | Discharge status: Home / Self Care | Payer: PRIVATE HEALTH INSURANCE | Attending: Emergency Medicine | Admitting: Emergency Medicine

## 2015-09-04 DIAGNOSIS — I10 Essential (primary) hypertension: Secondary | ICD-10-CM | POA: Diagnosis not present

## 2015-09-04 DIAGNOSIS — H00016 Hordeolum externum left eye, unspecified eyelid: Secondary | ICD-10-CM | POA: Diagnosis not present

## 2015-09-04 DIAGNOSIS — R197 Diarrhea, unspecified: Secondary | ICD-10-CM | POA: Diagnosis not present

## 2015-09-04 DIAGNOSIS — R55 Syncope and collapse: Secondary | ICD-10-CM | POA: Diagnosis not present

## 2015-09-04 DIAGNOSIS — R1031 Right lower quadrant pain: Secondary | ICD-10-CM | POA: Diagnosis present

## 2015-09-04 DIAGNOSIS — R112 Nausea with vomiting, unspecified: Secondary | ICD-10-CM | POA: Diagnosis not present

## 2015-09-04 DIAGNOSIS — Z9049 Acquired absence of other specified parts of digestive tract: Secondary | ICD-10-CM | POA: Diagnosis not present

## 2015-09-04 DIAGNOSIS — R630 Anorexia: Secondary | ICD-10-CM | POA: Diagnosis not present

## 2015-09-04 DIAGNOSIS — L299 Pruritus, unspecified: Secondary | ICD-10-CM | POA: Insufficient documentation

## 2015-09-04 HISTORY — DX: Essential (primary) hypertension: I10

## 2015-09-04 LAB — COMPREHENSIVE METABOLIC PANEL
ALBUMIN: 3.8 g/dL (ref 3.5–5.0)
ALT: 15 U/L — ABNORMAL LOW (ref 17–63)
AST: 17 U/L (ref 15–41)
Alkaline Phosphatase: 67 U/L (ref 38–126)
Anion gap: 7 (ref 5–15)
BILIRUBIN TOTAL: 0.4 mg/dL (ref 0.3–1.2)
BUN: 16 mg/dL (ref 6–20)
CHLORIDE: 105 mmol/L (ref 101–111)
CO2: 25 mmol/L (ref 22–32)
Calcium: 8.5 mg/dL — ABNORMAL LOW (ref 8.9–10.3)
Creatinine, Ser: 0.83 mg/dL (ref 0.61–1.24)
GFR calc Af Amer: >60 mL/min (ref >60)
GFR calc non Af Amer: >60 mL/min (ref >60)
Glucose, Bld: 90 mg/dL (ref 65–99)
POTASSIUM: 3.8 mmol/L (ref 3.5–5.1)
Sodium: 137 mmol/L (ref 135–145)
Total Protein: 7.2 g/dL (ref 6.5–8.1)

## 2015-09-04 LAB — CBC
HEMATOCRIT: 38.5 % — AB (ref 39.0–52.0)
HEMOGLOBIN: 13.2 g/dL (ref 13.0–17.0)
MCH: 31.2 pg (ref 26.0–34.0)
MCHC: 34.3 g/dL (ref 30.0–36.0)
MCV: 91 fL (ref 78.0–100.0)
Platelets: 212 10*3/uL (ref 150–400)
RBC: 4.23 MIL/uL (ref 4.22–5.81)
RDW: 12.6 % (ref 11.5–15.5)
WBC: 8.5 10*3/uL (ref 4.0–10.5)

## 2015-09-04 NOTE — ED Notes (Signed)
Pt c/o rt lower abd pain with bright red blood in stool. Pt was seen for the same Tuesday at [facility].

## 2015-09-05 LAB — URINALYSIS, ROUTINE W REFLEX MICROSCOPIC
BILIRUBIN URINE: NEGATIVE
GLUCOSE, UA: NEGATIVE mg/dL
HGB URINE DIPSTICK: NEGATIVE
KETONES UR: NEGATIVE mg/dL
Leukocytes, UA: NEGATIVE
Nitrite: NEGATIVE
PROTEIN: NEGATIVE mg/dL
Specific Gravity, Urine: 1.025 (ref 1.005–1.030)
pH: 6 (ref 5.0–8.0)

## 2015-09-05 LAB — POC OCCULT BLOOD, ED: FECAL OCCULT BLD: NEGATIVE

## 2015-09-05 MED ORDER — ONDANSETRON HCL 4 MG/2ML IJ SOLN
4.0000 mg | Freq: Once | INTRAMUSCULAR | Status: AC
  Administered 2015-09-05: 4 mg via INTRAVENOUS
  Filled 2015-09-05: qty 2

## 2015-09-05 MED ORDER — HYDROMORPHONE HCL 1 MG/ML IJ SOLN
1.0000 mg | Freq: Once | INTRAMUSCULAR | Status: AC
  Administered 2015-09-05: 1 mg via INTRAVENOUS
  Filled 2015-09-05: qty 1

## 2015-09-05 MED ORDER — PROMETHAZINE HCL 25 MG PO TABS: 25.0000 mg | ORAL_TABLET | Freq: Four times a day (QID) | ORAL | Status: DC | PRN

## 2015-09-05 NOTE — ED Provider Notes (Signed)
CSN: [id number]     Arrival date & time 09/04/15  2139 History   First MD Initiated Contact with Patient 09/05/15 0004     Chief Complaint  Patient presents with  . Abdominal Pain     (Consider location/radiation/quality/duration/timing/severity/associated sxs/prior Treatment) Patient is a 35 y.o. male presenting with abdominal pain. The history is provided by the patient. No language interpreter was used.  Abdominal Pain Pain location:  RLQ Pain quality: tearing   Pain radiates to:  Does not radiate Pain severity:  Severe Onset quality:  Gradual Duration:  1 day Timing:  Constant Progression:  Worsening Chronicity:  New Context: not recent illness, not sick contacts and not suspicious food intake   Relieved by:  Nothing Worsened by:  Deep breathing and palpation Ineffective treatments:  None tried Associated symptoms: anorexia, diarrhea, nausea and vomiting   Associated symptoms: no chest pain, no chills, no constipation, no cough, no dysuria, no fever and no shortness of breath    Matthew Vance is a 35 y.o. male who presents t to the ED with lower abdominal that started yesterday and has gotten progressively worse. He reports that in Feb. He had his gall bladder removed and has had some problems off and on since the but never this bad. He reports going to [facility] yesterday for the problem when it started. They treated his pain and did a CT scan and told the patient it was normal. He was d/c home and told told to come to [facility] if symptoms worsened. Patient was given referral to GI here in [place]. Today the pain increased and patient reports pain became so bad at work that he passed out. Patient foreman brought him to the ED. Patient reports bright red blood in stool.   Past Medical History  Diagnosis Date  . Hypertension    Past Surgical History  Procedure Laterality Date  . Cholecystectomy    . Knee surgery     No family history on file. Social History  Substance  Use Topics  . Smoking status: Never Smoker   . Smokeless tobacco: None  . Alcohol Use: Yes    Review of Systems  Constitutional: Negative for fever and chills.  HENT: Negative.   Eyes: Positive for redness and itching. Negative for visual disturbance.       Sty left lower lid  Respiratory: Negative for cough, chest tightness and shortness of breath.   Cardiovascular: Negative for chest pain.  Gastrointestinal: Positive for nausea, vomiting, abdominal pain, diarrhea and anorexia. Negative for constipation.  Genitourinary: Negative for dysuria, frequency and flank pain.  Musculoskeletal: Negative for back pain.  Skin: Negative for rash.  Neurological: Positive for syncope. Negative for headaches.  Psychiatric/Behavioral: Negative for confusion. The patient is not nervous/anxious.       Allergies  Review of patient's allergies indicates no known allergies.  Home Medications   Prior to Admission medications   Medication Sig Start Date End Date Taking? Authorizing Provider  FLUoxetine (PROZAC) 20 MG capsule Take 20 mg by mouth at bedtime.    Historical Provider, MD  promethazine (PHENERGAN) 25 MG tablet Take 1 tablet (25 mg total) by mouth every 6 (six) hours as needed for nausea or vomiting. 09/05/15   Hope Bunnie Pion, NP  TRAZODONE HCL PO Take 2 tablets by mouth at bedtime.    Historical Provider, MD   BP 122/85 mmHg  Pulse 86  Temp(Src) 98.3 F (36.8 C) (Oral)  Resp 27  Ht 6' 4"  (1.93 m)  Wt 138.347 kg  BMI 37.14 kg/m2  SpO2 98% Physical Exam  Constitutional: He is oriented to person, place, and time. He appears well-developed and well-nourished. No distress.  HENT:  Head: Normocephalic and atraumatic.  Eyes: Conjunctivae and EOM are normal. Pupils are equal, round, and reactive to light. Left eye exhibits hordeolum.  Neck: Normal range of motion. Neck supple.  Cardiovascular: Normal rate and regular rhythm.   Pulmonary/Chest: Effort normal and breath sounds normal.   Abdominal: Soft. Bowel sounds are normal. There is tenderness in the right lower quadrant. There is no rebound, no guarding and no CVA tenderness.  Genitourinary: Rectal exam shows no external hemorrhoid, no fissure and anal tone normal. Guaiac negative stool.  Musculoskeletal: Normal range of motion.  Neurological: He is alert and oriented to person, place, and time. No cranial nerve deficit.  Skin: Skin is warm and dry.  Psychiatric: He has a normal mood and affect. His behavior is normal.  Nursing note and vitals reviewed.   ED Course  Procedures (including critical care time) Labs Review Results for orders placed or performed during the hospital encounter of 09/04/15 (from the past 24 hour(s))  Comprehensive metabolic panel     Status: Abnormal   Collection Time: 09/04/15 10:53 PM  Result Value Ref Range   Sodium 137 135 - 145 mmol/L   Potassium 3.8 3.5 - 5.1 mmol/L   Chloride 105 101 - 111 mmol/L   CO2 25 22 - 32 mmol/L   Glucose, Bld 90 65 - 99 mg/dL   BUN 16 6 - 20 mg/dL   Creatinine, Ser 0.83 0.61 - 1.24 mg/dL   Calcium 8.5 (L) 8.9 - 10.3 mg/dL   Total Protein 7.2 6.5 - 8.1 g/dL   Albumin 3.8 3.5 - 5.0 g/dL   AST 17 15 - 41 U/L   ALT 15 (L) 17 - 63 U/L   Alkaline Phosphatase 67 38 - 126 U/L   Total Bilirubin 0.4 0.3 - 1.2 mg/dL   GFR calc non Af Amer >60 >60 mL/min   GFR calc Af Amer >60 >60 mL/min   Anion gap 7 5 - 15  CBC     Status: Abnormal   Collection Time: 09/04/15 10:53 PM  Result Value Ref Range   WBC 8.5 4.0 - 10.5 K/uL   RBC 4.23 4.22 - 5.81 MIL/uL   Hemoglobin 13.2 13.0 - 17.0 g/dL   HCT 38.5 (L) 39.0 - 52.0 %   MCV 91.0 78.0 - 100.0 fL   MCH 31.2 26.0 - 34.0 pg   MCHC 34.3 30.0 - 36.0 g/dL   RDW 12.6 11.5 - 15.5 %   Platelets 212 150 - 400 K/uL  Urinalysis, Routine w reflex microscopic (not at [facility])     Status: Abnormal   Collection Time: 09/05/15  1:00 AM  Result Value Ref Range   Color, Urine YELLOW YELLOW   APPearance HAZY (A) CLEAR    Specific Gravity, Urine 1.025 1.005 - 1.030   pH 6.0 5.0 - 8.0   Glucose, UA NEGATIVE NEGATIVE mg/dL   Hgb urine dipstick NEGATIVE NEGATIVE   Bilirubin Urine NEGATIVE NEGATIVE   Ketones, ur NEGATIVE NEGATIVE mg/dL   Protein, ur NEGATIVE NEGATIVE mg/dL   Nitrite NEGATIVE NEGATIVE   Leukocytes, UA NEGATIVE NEGATIVE  POC occult blood, ED     Status: None   Collection Time: 09/05/15  1:05 AM  Result Value Ref Range   Fecal Occult Bld NEGATIVE NEGATIVE    CT scan from [facility]  ED in [place] from last night reviewed.   Imaging Review No results found. I have personally reviewed and evaluated these images and lab results as part of my medical decision-making.    EKG Interpretation   Date/Time:  Wednesday Sep 04 2015 23:17:32 EDT Ventricular Rate:  74 PR Interval:  167 QRS Duration: 103 QT Interval:  388 QTC Calculation: 430 R Axis:   77 Text Interpretation:  Sinus rhythm Confirmed by HORTON  MD, Keyser  ([id number]) on 09/05/2015 12:24:17 AM      0135 in to re evaluate the patient and discuss lab results, patient is without pain at this time.   I discussed this case with Dr. Dina Rich.  MDM  35 y.o. male with RLQ abdominal pain that has been off and on for the past several months but worse tonight. Stable for d/c with normal labs and CT from last night at another facility that shows not obstruction or appendicitis. Discussed with the patient lab, CT and clinical findings and plan of care and all questioned fully answered. He will f/u with GI as planned and return  if any problems arise.   Final diagnoses:  Right lower quadrant abdominal pain       [facility], NP 09/05/15 [place], MD 09/05/15 [phone]

## 2015-09-05 NOTE — ED Notes (Signed)
Physician in to assess 

## 2015-09-05 NOTE — ED Notes (Signed)
DCd per MD instruction. Pt verbalizes understanding of DC instruct, follow up with GI, call tomorrow to see if appt can be sooner or he be worked in for appt, and med regime and caution. Also encouraqged to return if any concern or feels worse

## 2015-09-05 NOTE — Discharge Instructions (Signed)
Your labs tonight are normal. We reviewed the results of the CT scan from last night and there is no bowel obstruction or appendicitis. Follow up with GI as planned. Call the office to see if they can work you in sooner.  Continue to take the pain medication as needed and take the medication for nausea as needed. Return as needed for worsening symptoms.   Abdominal Pain, Adult Many things can cause abdominal pain. Usually, abdominal pain is not caused by a disease and will improve without treatment. It can often be observed and treated at home. Your health care provider will do a physical exam and possibly order blood tests and X-rays to help determine the seriousness of your pain. However, in many cases, more time must pass before a clear cause of the pain can be found. Before that point, your health care provider may not know if you need more testing or further treatment. HOME CARE INSTRUCTIONS Monitor your abdominal pain for any changes. The following actions may help to alleviate any discomfort you are experiencing:  Only take over-the-counter or prescription medicines as directed by your health care provider.  Do not take laxatives unless directed to do so by your health care provider.  Try a clear liquid diet (broth, tea, or water) as directed by your health care provider. Slowly move to a bland diet as tolerated. SEEK MEDICAL CARE IF:  You have unexplained abdominal pain.  You have abdominal pain associated with nausea or diarrhea.  You have pain when you urinate or have a bowel movement.  You experience abdominal pain that wakes you in the night.  You have abdominal pain that is worsened or improved by eating food.  You have abdominal pain that is worsened with eating fatty foods.  You have a fever. SEEK IMMEDIATE MEDICAL CARE IF:  Your pain does not go away within 2 hours.  You keep throwing up (vomiting).  Your pain is felt only in portions of the abdomen, such as the  right side or the left lower portion of the abdomen.  You pass bloody or black tarry stools. MAKE SURE YOU:  Understand these instructions.  Will watch your condition.  Will get help right away if you are not doing well or get worse.   This information is not intended to replace advice given to you by your health care provider. Make sure you discuss any questions you have with your health care provider.   Document Released: 01/14/2005 Document Revised: 12/26/2014 Document Reviewed: 12/14/2012 Elsevier Interactive Patient Education [employer].

## 2015-09-11 ENCOUNTER — Ambulatory Visit (INDEPENDENT_AMBULATORY_CARE_PROVIDER_SITE_OTHER): Payer: PRIVATE HEALTH INSURANCE | Admitting: Gastroenterology

## 2015-09-11 ENCOUNTER — Encounter: Payer: Self-pay | Admitting: Gastroenterology

## 2015-09-11 VITALS — BP 137/94 | HR 85 | Temp 97.5°F | Ht 76.0 in | Wt 307.6 lb

## 2015-09-11 DIAGNOSIS — R634 Abnormal weight loss: Secondary | ICD-10-CM | POA: Insufficient documentation

## 2015-09-11 DIAGNOSIS — R197 Diarrhea, unspecified: Secondary | ICD-10-CM

## 2015-09-11 DIAGNOSIS — K625 Hemorrhage of anus and rectum: Secondary | ICD-10-CM

## 2015-09-11 DIAGNOSIS — R1030 Lower abdominal pain, unspecified: Secondary | ICD-10-CM

## 2015-09-11 NOTE — Patient Instructions (Addendum)
1. We have requested old records for review. Once reviewed, Dr. Gala Romney and I will let you know the next step. Hopefully will have answers for you tomorrow.

## 2015-09-11 NOTE — Progress Notes (Signed)
Primary Care Physician:  Gar Ponto, MD  Primary Gastroenterologist:  Garfield Cornea, MD   Chief Complaint  Patient presents with  . Rectal Bleeding  . Abdominal Pain  . Nausea  . Emesis  . Diarrhea    HPI:  Matthew Vance is a 35 y.o. male here for semiurgent office visit for further evaluation of ongoing abdominal pain, vomiting, diarrhea, rectal bleeding. Dr. Quillian Quince called and spoke with Dr. Gala Romney requesting patient to be seen regarding the above matters. He has had an extensive evaluation including hospitalization but ongoing symptoms.  Patient states that he's been having abdominal cramping/right sided abdominal pain associated with nausea and intermittent vomiting, diarrhea fairly persistent this year. Similar symptoms which led to hospitalization March 2015 but states since that admission he has had more frequent and severe episodes.   Back in February symptoms became severe. Initially underwent a colonoscopy with Dr. Britta Mccreedy.  Reported to be normal. Small bowel follow-through was normal. Next he underwent a HIDA scan which was abnormal. Cholecystectomy performed, noted to have cholelithiasis with chronic cholecystitis. No improvement in symptoms. In March he had an EGD which showed mild gastritis. MRCP was negative. Multiple CT scans as outlined below.   Earlier this month he had episode of bright red blood per rectum, passed blood on multiple occasions. Described as moderate volume.  Complained of nocturnal diarrhea. 7-8 loose stools daily. No solid stools. Stool studies were negative for C. difficile amongst other pathogens. See below for details. Continues to have right-sided abdominal pain, constant. No recent vomiting. Overall his lost about 60 pounds since February. Denies heartburn.     CT abdomen and pelvis without contrast at [facility] on 09/03/2015 Impression: Small nonobstructing right renal calculi. No hydronephrosis. Small fat-containing umbilical  hernia.  MRI/MRCP at [facility] on 06/28/2015 Impression: Post cholecystectomy. Normal liver and biliary tree. Normal pancreas on noncontrast exam.  CT of the abdomen and pelvis with contrast at [facility] on 06/26/2015 Impression: Stable or slightly improved slight stranding in the cholecystectomy bed, likely related to recent postoperative state. No measurable fluid collection.  CT of the abdomen and pelvis with contrast at [facility] on 06/17/2015 Impression: Post cholecystectomy. There is a small amount of fluid and stranding around the cholecystectomy bed in the hepatic flexure. As likely represents expected postoperative changes. Nonobstructing right kidney stones.  Small bowel follow-through at [facility] on 06/06/2015 Impression normal small bowel follow-through study.  CT abdomen and pelvis without contrast at [facility] on 05/21/2015 Impression: Bilateral nonobstructing nephrolithiasis. No hydronephrosis. Small fat-containing umbilical hernia.  Current Outpatient Prescriptions  Medication Sig Dispense Refill  . busPIRone (BUSPAR) 15 MG tablet Take by mouth.    . dicyclomine (BENTYL) 20 MG tablet Take 20 mg by mouth daily.    Marland Kitchen FLUoxetine (PROZAC) 20 MG capsule Take 20 mg by mouth at bedtime.    Marland Kitchen lisinopril-hydrochlorothiazide (PRINZIDE,ZESTORETIC) 10-12.5 MG tablet Take 1 tablet by mouth daily.    . ondansetron (ZOFRAN) 4 MG tablet Take 4 mg by mouth every 8 (eight) hours as needed for nausea or vomiting.    . Oxycodone-Acetaminophen (PERCOCET PO) Take by mouth as needed.    . pantoprazole (PROTONIX) 40 MG tablet Take by mouth.     No current facility-administered medications for this visit.    Allergies as of 09/11/2015  . (No Known Allergies)    Past Medical History  Diagnosis Date  . Hypertension     Past Surgical History  Procedure Laterality Date  . Cholecystectomy  06/14/15    Dr.  Karlyn Agee: path: cholelithiasis with chronic cholecystitis  . Knee surgery    . Colonoscopy  06/04/15    Dr. Doristine Mango: normal  . Esophagogastroduodenoscopy  06/27/15    Dr. Karlyn Agee: gastritis    Family History  Problem Relation Age of Onset  . Irritable bowel syndrome Sister   . Crohn's disease Neg Hx   . Colitis Maternal Aunt     had colostomy  . Irritable bowel syndrome Paternal Grandmother   . Colon cancer Neg Hx   . Celiac disease Neg Hx     Social History   Social History  . Marital Status: Single    Spouse Name: N/A  . Number of Children: 2  . Years of Education: N/A   Occupational History  . [employer]    Social History Main Topics  . Smoking status: Never Smoker   . Smokeless tobacco: Not on file  . Alcohol Use: 0.0 oz/week    0 Standard drinks or equivalent per week     Comment: rare  . Drug Use: Not on file  . Sexual Activity: Not on file   Other Topics Concern  . Not on file   Social History Narrative      ROS:  General: Negative for anorexia,   fever, chills, fatigue, weakness. See hpi. Eyes: Negative for vision changes.  ENT: Negative for hoarseness, difficulty swallowing , nasal congestion. CV: Negative for chest pain, angina, palpitations, dyspnea on exertion, peripheral edema.  Respiratory: Negative for dyspnea at rest, dyspnea on exertion, cough, sputum, wheezing.  GI: See history of present illness. GU:  Negative for dysuria, hematuria, urinary incontinence, urinary frequency, nocturnal urination.  MS: Negative for joint pain, low back pain.  Derm: Negative for rash or itching.  Neuro: Negative for weakness, abnormal sensation, seizure, frequent headaches, memory loss, confusion.  Psych: Negative for anxiety, depression, suicidal ideation, hallucinations.  Endo: see hpi Heme: Negative for bruising or bleeding. Allergy: Negative for rash or hives.    Physical Examination:  BP 137/94 mmHg  Pulse 85  Temp(Src)  97.5 F (36.4 C)  Ht 6\' 4"  (1.93 m)  Wt 307 lb 9.6 oz (139.526 kg)  BMI 37.46 kg/m2   General: Well-nourished, well-developed in no acute distress. Accompanied by mother. Head: Normocephalic, atraumatic.   Eyes: Conjunctiva pink, no icterus. Mouth: Oropharyngeal mucosa moist and pink , no lesions erythema or exudate. Neck: Supple without thyromegaly, masses, or lymphadenopathy.  Lungs: Clear to auscultation bilaterally.  Heart: Regular rate and rhythm, no murmurs rubs or gallops.  Abdomen: Bowel sounds are normal, moderate right sided tenderness (mid-to-lower), nondistended, no hepatosplenomegaly or masses, no abdominal bruits or    hernia , no rebound or guarding.   Rectal: not performed Extremities: No lower extremity edema. No clubbing or deformities.  Neuro: Alert and oriented x 4 , grossly normal neurologically.  Skin: Warm and dry, no rash or jaundice.   Psych: Alert and cooperative, normal mood and affect.  Labs: Lab Results  Component Value Date   WBC 8.5 09/04/2015   HGB 13.2 09/04/2015   HCT 38.5* 09/04/2015   MCV 91.0 09/04/2015   PLT 212 09/04/2015   Lab Results  Component Value Date   CREATININE 0.83 09/04/2015   BUN 16 09/04/2015   NA 137 09/04/2015   K 3.8 09/04/2015   CL 105 09/04/2015   CO2 25 09/04/2015   Lab Results  Component Value Date  ALT 15* 09/04/2015   AST 17 09/04/2015   ALKPHOS 67 09/04/2015   BILITOT 0.4 09/04/2015   Lipase 20.7 on 09/04/2015                Labs from 06/26/2015 white blood cell count 9.8, hemoglobin 14.3, platelets 229,000, eosinophils 0.4%, urine drug screen negative. H pylori negative. Lead none detected. Arsenic 9, mercury 1.5  09/03/2015 Stool culture negative, Campylobacter stool antigen negative, C. difficile negative, Enterohemorrhagic Escherichia coli negative

## 2015-09-12 ENCOUNTER — Telehealth: Payer: Self-pay | Admitting: Gastroenterology

## 2015-09-12 DIAGNOSIS — R197 Diarrhea, unspecified: Secondary | ICD-10-CM

## 2015-09-12 DIAGNOSIS — K625 Hemorrhage of anus and rectum: Secondary | ICD-10-CM

## 2015-09-12 NOTE — Telephone Encounter (Signed)
Please let patient know that I have reviewed all of his records. Discussed case with Dr. Gala Romney. The following are his recommendations  1. Labs for CBC with differential, TTG, IgA level. Orders placed. 2. Ileocolonoscopy with Dr. Gala Romney with DEEP SEDATION in [place]. Please allow extra time.

## 2015-09-13 ENCOUNTER — Other Ambulatory Visit: Payer: Self-pay

## 2015-09-13 MED ORDER — PEG-KCL-NACL-NASULF-NA ASC-C 100 G PO SOLR: 1.0000 | ORAL | Status: DC

## 2015-09-13 NOTE — Telephone Encounter (Signed)
PT IS AWARE TO GO HAVE LAB WORK DONE. HE IS ALSO SET UP FOR A TCS ON 10/07/15 @ 7:30. HE IS AWARE OF THE DATE AND TIME. INSTRUCTIONS ARE IN THE MAIL AND RX SENT TO PHARMACY.

## 2015-09-13 NOTE — Telephone Encounter (Signed)
Pt is aware. Lab orders sent to the lab. He will go by and have it done. Ginger has already scheduled pts procedure.

## 2015-09-15 ENCOUNTER — Telehealth: Payer: Self-pay | Admitting: Internal Medicine

## 2015-09-15 NOTE — Telephone Encounter (Signed)
Patient's grandmother, Marcia Brash, called me this evening stating patient's had a bad "attack". Laying in the floor with writhing abdominal pain and has been throwing most of the day. Phenergan tablets have not helped.  I recommend the patient come to the emergency department.  She tells me colonoscopy scheduled for the 19th. Will attempt to move that up to some degree.

## 2015-09-17 ENCOUNTER — Encounter (HOSPITAL_COMMUNITY): Payer: Self-pay | Admitting: Emergency Medicine

## 2015-09-17 ENCOUNTER — Encounter: Payer: Self-pay | Admitting: Gastroenterology

## 2015-09-17 ENCOUNTER — Emergency Department (HOSPITAL_COMMUNITY)
Admission: EM | Admit: 2015-09-17 | Discharge: 2015-09-17 | Disposition: A | Discharge status: Home / Self Care | Payer: PRIVATE HEALTH INSURANCE | Attending: Dermatology | Admitting: Dermatology

## 2015-09-17 DIAGNOSIS — R195 Other fecal abnormalities: Secondary | ICD-10-CM | POA: Insufficient documentation

## 2015-09-17 DIAGNOSIS — Z5321 Procedure and treatment not carried out due to patient leaving prior to being seen by health care provider: Secondary | ICD-10-CM | POA: Diagnosis not present

## 2015-09-17 DIAGNOSIS — Z79899 Other long term (current) drug therapy: Secondary | ICD-10-CM | POA: Insufficient documentation

## 2015-09-17 DIAGNOSIS — I10 Essential (primary) hypertension: Secondary | ICD-10-CM | POA: Diagnosis not present

## 2015-09-17 DIAGNOSIS — R1084 Generalized abdominal pain: Secondary | ICD-10-CM | POA: Diagnosis present

## 2015-09-17 DIAGNOSIS — R197 Diarrhea, unspecified: Secondary | ICD-10-CM | POA: Diagnosis not present

## 2015-09-17 DIAGNOSIS — R112 Nausea with vomiting, unspecified: Secondary | ICD-10-CM | POA: Diagnosis not present

## 2015-09-17 LAB — COMPREHENSIVE METABOLIC PANEL
ALBUMIN: 4.1 g/dL (ref 3.5–5.0)
ALT: 17 U/L (ref 17–63)
AST: 15 U/L (ref 15–41)
Alkaline Phosphatase: 80 U/L (ref 38–126)
Anion gap: 8 (ref 5–15)
BUN: 14 mg/dL (ref 6–20)
CHLORIDE: 103 mmol/L (ref 101–111)
CO2: 25 mmol/L (ref 22–32)
Calcium: 8.7 mg/dL — ABNORMAL LOW (ref 8.9–10.3)
Creatinine, Ser: 0.81 mg/dL (ref 0.61–1.24)
GFR calc Af Amer: >60 mL/min (ref >60)
GFR calc non Af Amer: >60 mL/min (ref >60)
GLUCOSE: 97 mg/dL (ref 65–99)
POTASSIUM: 3.8 mmol/L (ref 3.5–5.1)
SODIUM: 136 mmol/L (ref 135–145)
TOTAL PROTEIN: 7.9 g/dL (ref 6.5–8.1)
Total Bilirubin: 0.4 mg/dL (ref 0.3–1.2)

## 2015-09-17 LAB — CBC
HEMATOCRIT: 40.5 % (ref 39.0–52.0)
HEMOGLOBIN: 14.1 g/dL (ref 13.0–17.0)
MCH: 31.5 pg (ref 26.0–34.0)
MCHC: 34.8 g/dL (ref 30.0–36.0)
MCV: 90.6 fL (ref 78.0–100.0)
Platelets: 222 10*3/uL (ref 150–400)
RBC: 4.47 MIL/uL (ref 4.22–5.81)
RDW: 12.5 % (ref 11.5–15.5)
WBC: 10.5 10*3/uL (ref 4.0–10.5)

## 2015-09-17 LAB — LIPASE, BLOOD: Lipase: 24 U/L (ref 11–51)

## 2015-09-17 NOTE — Telephone Encounter (Signed)
Pt lives in [place], [place], will you see if there are any records at [facility]?

## 2015-09-17 NOTE — Assessment & Plan Note (Signed)
35 year old gentleman with right-sided abdominal pain associated with nausea/vomiting/diarrhea, rectal bleeding. Extensive workup as outlined above. No improvement of symptoms status post cholecystectomy in February. EGD and colonoscopy essentially unremarkable as outlined. Multiple imaging studies without etiology to explain his symptoms. Patient has lost approximate 60 pounds this year. Stool studies have been negative. He has not been screened for celiac disease therefore we will do that.  At the time of this dictation, patient's records have been received and completely reviewed. Discussed findings with Dr. Gala Romney. Plan on ileocolonoscopy to exclude Crohn's disease as well as labs to screen for celiac disease. Plan for deep sedation in the OR given polypharmacy.  I have discussed the risks, alternatives, benefits with regards to but not limited to the risk of reaction to medication, bleeding, infection, perforation and the patient is agreeable to proceed. Written consent to be obtained.

## 2015-09-17 NOTE — ED Notes (Signed)
Pt reports generalized abdominal pain, n/v/d, and bright red blood in stools. States this has been an ongoing problem for months. Pt seeing Dr. Mitzi Davenport for this problem.

## 2015-09-17 NOTE — Telephone Encounter (Signed)
Please find out if patient went to the ER. No records of ER visit last night at [facility].   See if colonoscopy in or can be moved up as per RMR.

## 2015-09-17 NOTE — Telephone Encounter (Signed)
Requested records from [facility]

## 2015-09-18 LAB — CBC WITH DIFFERENTIAL/PLATELET
Basophils Absolute: 0 cells/uL (ref 0–200)
Basophils Relative: 0 %
EOS PCT: 1 %
Eosinophils Absolute: 91 cells/uL (ref 15–500)
HCT: 40.8 % (ref 38.5–50.0)
HEMOGLOBIN: 14.1 g/dL (ref 13.2–17.1)
LYMPHS ABS: 2275 {cells}/uL (ref 850–3900)
Lymphocytes Relative: 25 %
MCH: 31.5 pg (ref 27.0–33.0)
MCHC: 34.6 g/dL (ref 32.0–36.0)
MCV: 91.1 fL (ref 80.0–100.0)
MPV: 10.5 fL (ref 7.5–12.5)
Monocytes Absolute: 637 cells/uL (ref 200–950)
Monocytes Relative: 7 %
NEUTROS ABS: 6097 {cells}/uL (ref 1500–7800)
Neutrophils Relative %: 67 %
Platelets: 250 10*3/uL (ref 140–400)
RBC: 4.48 MIL/uL (ref 4.20–5.80)
RDW: 13.1 % (ref 11.0–15.0)
WBC: 9.1 10*3/uL (ref 3.8–10.8)

## 2015-09-18 LAB — TISSUE TRANSGLUTAMINASE, IGA: TISSUE TRANSGLUTAMINASE AB, IGA: 1 U/mL (ref <4)

## 2015-09-18 LAB — IGA: IGA: 241 mg/dL (ref 81–463)

## 2015-09-18 NOTE — Telephone Encounter (Signed)
Most recent records from [facility] ED 09/04/15.

## 2015-09-18 NOTE — Telephone Encounter (Signed)
Pt procedure has been moved up to 06/08.  Pt is aware of new date and time and new pre op appt.

## 2015-09-19 NOTE — Telephone Encounter (Signed)
Quick Note:  Please let patient know his celiac screen was negative. CBC normal. TCS as planned. ______

## 2015-09-23 ENCOUNTER — Encounter (HOSPITAL_COMMUNITY): Payer: Self-pay

## 2015-09-24 ENCOUNTER — Encounter (HOSPITAL_COMMUNITY)
Admission: RE | Admit: 2015-09-24 | Discharge: 2015-09-24 | Disposition: A | Discharge status: Home / Self Care | Payer: PRIVATE HEALTH INSURANCE | Source: Ambulatory Visit | Attending: Internal Medicine | Admitting: Internal Medicine

## 2015-09-24 ENCOUNTER — Ambulatory Visit: Payer: PRIVATE HEALTH INSURANCE | Admitting: Gastroenterology

## 2015-09-24 HISTORY — DX: Anxiety disorder, unspecified: F41.9

## 2015-09-24 HISTORY — DX: Gastro-esophageal reflux disease without esophagitis: K21.9

## 2015-09-26 ENCOUNTER — Ambulatory Visit (HOSPITAL_COMMUNITY): Payer: PRIVATE HEALTH INSURANCE | Admitting: Anesthesiology

## 2015-09-26 ENCOUNTER — Encounter (HOSPITAL_COMMUNITY)
Admission: RE | Disposition: A | Discharge status: Home / Self Care | Payer: Self-pay | Source: Ambulatory Visit | Attending: Internal Medicine

## 2015-09-26 ENCOUNTER — Encounter (HOSPITAL_COMMUNITY): Payer: Self-pay | Admitting: *Deleted

## 2015-09-26 ENCOUNTER — Ambulatory Visit (HOSPITAL_COMMUNITY)
Admission: RE | Admit: 2015-09-26 | Discharge: 2015-09-26 | Disposition: A | Discharge status: Home / Self Care | Payer: PRIVATE HEALTH INSURANCE | Source: Ambulatory Visit | Attending: Internal Medicine | Admitting: Internal Medicine

## 2015-09-26 DIAGNOSIS — K529 Noninfective gastroenteritis and colitis, unspecified: Secondary | ICD-10-CM | POA: Diagnosis not present

## 2015-09-26 DIAGNOSIS — D123 Benign neoplasm of transverse colon: Secondary | ICD-10-CM

## 2015-09-26 DIAGNOSIS — I10 Essential (primary) hypertension: Secondary | ICD-10-CM | POA: Diagnosis not present

## 2015-09-26 DIAGNOSIS — R933 Abnormal findings on diagnostic imaging of other parts of digestive tract: Secondary | ICD-10-CM | POA: Insufficient documentation

## 2015-09-26 DIAGNOSIS — K6389 Other specified diseases of intestine: Secondary | ICD-10-CM | POA: Diagnosis not present

## 2015-09-26 DIAGNOSIS — K921 Melena: Secondary | ICD-10-CM | POA: Insufficient documentation

## 2015-09-26 DIAGNOSIS — K640 First degree hemorrhoids: Secondary | ICD-10-CM | POA: Insufficient documentation

## 2015-09-26 DIAGNOSIS — Z79899 Other long term (current) drug therapy: Secondary | ICD-10-CM | POA: Diagnosis not present

## 2015-09-26 DIAGNOSIS — Z860100 Personal history of colon polyps, unspecified: Secondary | ICD-10-CM | POA: Insufficient documentation

## 2015-09-26 DIAGNOSIS — R634 Abnormal weight loss: Secondary | ICD-10-CM | POA: Diagnosis not present

## 2015-09-26 HISTORY — PX: COLONOSCOPY WITH PROPOFOL: SHX5780

## 2015-09-26 SURGERY — COLONOSCOPY WITH PROPOFOL
Anesthesia: Monitor Anesthesia Care

## 2015-09-26 MED ORDER — MIDAZOLAM HCL 2 MG/2ML IJ SOLN
INTRAMUSCULAR | Status: AC
  Filled 2015-09-26: qty 2

## 2015-09-26 MED ORDER — FENTANYL CITRATE (PF) 100 MCG/2ML IJ SOLN
INTRAMUSCULAR | Status: AC
  Filled 2015-09-26: qty 2

## 2015-09-26 MED ORDER — PROPOFOL 10 MG/ML IV BOLUS
INTRAVENOUS | Status: AC
  Filled 2015-09-26: qty 20

## 2015-09-26 MED ORDER — MIDAZOLAM HCL 2 MG/2ML IJ SOLN
1.0000 mg | INTRAMUSCULAR | Status: DC | PRN
  Administered 2015-09-26: 2 mg via INTRAVENOUS

## 2015-09-26 MED ORDER — FENTANYL CITRATE (PF) 100 MCG/2ML IJ SOLN: 25.0000 ug | INTRAMUSCULAR | Status: DC | PRN

## 2015-09-26 MED ORDER — FENTANYL CITRATE (PF) 100 MCG/2ML IJ SOLN
25.0000 ug | INTRAMUSCULAR | Status: AC
  Administered 2015-09-26 (×2): 25 ug via INTRAVENOUS

## 2015-09-26 MED ORDER — PROPOFOL 500 MG/50ML IV EMUL
INTRAVENOUS | Status: DC | PRN
  Administered 2015-09-26: 08:00:00 via INTRAVENOUS
  Administered 2015-09-26: 150 ug/kg/min via INTRAVENOUS
  Administered 2015-09-26: 08:00:00 via INTRAVENOUS

## 2015-09-26 MED ORDER — ONDANSETRON HCL 4 MG/2ML IJ SOLN
4.0000 mg | Freq: Once | INTRAMUSCULAR | Status: AC
  Administered 2015-09-26: 4 mg via INTRAVENOUS

## 2015-09-26 MED ORDER — ONDANSETRON HCL 4 MG/2ML IJ SOLN: 4.0000 mg | Freq: Once | INTRAMUSCULAR | Status: DC | PRN

## 2015-09-26 MED ORDER — MIDAZOLAM HCL 5 MG/5ML IJ SOLN
INTRAMUSCULAR | Status: DC | PRN
  Administered 2015-09-26: 2 mg via INTRAVENOUS

## 2015-09-26 MED ORDER — LACTATED RINGERS IV SOLN
INTRAVENOUS | Status: DC
  Administered 2015-09-26: 1000 mL via INTRAVENOUS

## 2015-09-26 MED ORDER — ONDANSETRON HCL 4 MG/2ML IJ SOLN
INTRAMUSCULAR | Status: AC
  Filled 2015-09-26: qty 2

## 2015-09-26 NOTE — Anesthesia Preprocedure Evaluation (Signed)
Anesthesia Evaluation  Patient identified by MRN, date of birth, ID band Patient awake    Reviewed: Allergy & Precautions, NPO status , Patient's Chart, lab work & pertinent test results  Airway Mallampati: I  TM Distance: >3 FB     Dental  (+) Teeth Intact   Pulmonary neg pulmonary ROS,    breath sounds clear to auscultation       Cardiovascular hypertension, Pt. on medications  Rhythm:Regular Rate:Normal     Neuro/Psych PSYCHIATRIC DISORDERS Anxiety    GI/Hepatic GERD  ,  Endo/Other    Renal/GU      Musculoskeletal   Abdominal   Peds  Hematology   Anesthesia Other Findings   Reproductive/Obstetrics                             Anesthesia Physical Anesthesia Plan  ASA: II  Anesthesia Plan: MAC   Post-op Pain Management:    Induction: Intravenous  Airway Management Planned: Simple Face Mask  Additional Equipment:   Intra-op Plan:   Post-operative Plan:   Informed Consent: I have reviewed the patients History and Physical, chart, labs and discussed the procedure including the risks, benefits and alternatives for the proposed anesthesia with the patient or authorized representative who has indicated his/her understanding and acceptance.     Plan Discussed with:   Anesthesia Plan Comments:         Anesthesia Quick Evaluation

## 2015-09-26 NOTE — Discharge Instructions (Addendum)
Colonoscopy Discharge Instructions  Read the instructions outlined below and refer to this sheet in the next few weeks. These discharge instructions provide you with general information on caring for yourself after you leave the hospital. Your doctor may also give you specific instructions. While your treatment has been planned according to the most current medical practices available, unavoidable complications occasionally occur. If you have any problems or questions after discharge, call Dr. Gala Romney at [phone]. ACTIVITY  You may resume your regular activity, but move at a slower pace for the next 24 hours.   Take frequent rest periods for the next 24 hours.   Walking will help get rid of the air and reduce the bloated feeling in your belly (abdomen).   No driving for 24 hours (because of the medicine (anesthesia) used during the test).    Do not sign any important legal documents or operate any machinery for 24 hours (because of the anesthesia used during the test).  NUTRITION  Drink plenty of fluids.   You may resume your normal diet as instructed by your doctor.   Begin with a light meal and progress to your normal diet. Heavy or fried foods are harder to digest and may make you feel sick to your stomach (nauseated).   Avoid alcoholic beverages for 24 hours or as instructed.  MEDICATIONS  You may resume your normal medications unless your doctor tells you otherwise.  WHAT YOU CAN EXPECT TODAY  Some feelings of bloating in the abdomen.   Passage of more gas than usual.   Spotting of blood in your stool or on the toilet paper.  IF YOU HAD POLYPS REMOVED DURING THE COLONOSCOPY:  No aspirin products for 7 days or as instructed.   No alcohol for 7 days or as instructed.   Eat a soft diet for the next 24 hours.  FINDING OUT THE RESULTS OF YOUR TEST Not all test results are available during your visit. If your test results are not back during the visit, make an appointment  with your caregiver to find out the results. Do not assume everything is normal if you have not heard from your caregiver or the medical facility. It is important for you to follow up on all of your test results.  SEEK IMMEDIATE MEDICAL ATTENTION IF:  You have more than a spotting of blood in your stool.   Your belly is swollen (abdominal distention).   You are nauseated or vomiting.   You have a temperature over 101.   You have abdominal pain or discomfort that is severe or gets worse throughout the day.    Colon polyp information provided  See Dr. Quillian Quince regarding a potential sleep study to further investigate poor sleep and snoring  Further recommendations to follow pending review of pathology report    Colon Polyps Polyps are lumps of extra tissue growing inside the body. Polyps can grow in the large intestine (colon). Most colon polyps are noncancerous (benign). However, some colon polyps can become cancerous over time. Polyps that are larger than a pea may be harmful. To be safe, caregivers remove and test all polyps. CAUSES  Polyps form when mutations in the genes cause your cells to grow and divide even though no more tissue is needed. RISK FACTORS There are a number of risk factors that can increase your chances of getting colon polyps. They include: Being older than 50 years. Family history of colon polyps or colon cancer. Long-term colon diseases, such as colitis or  Crohn disease. Being overweight. Smoking. Being inactive. Drinking too much alcohol. SYMPTOMS  Most small polyps do not cause symptoms. If symptoms are present, they may include: Blood in the stool. The stool may look dark red or black. Constipation or diarrhea that lasts longer than 1 week. DIAGNOSIS People often do not know they have polyps until their caregiver finds them during a regular checkup. Your caregiver can use 4 tests to check for polyps: Digital rectal exam. The caregiver wears gloves and  feels inside the rectum. This test would find polyps only in the rectum. Barium enema. The caregiver puts a liquid called barium into your rectum before taking X-rays of your colon. Barium makes your colon look white. Polyps are dark, so they are easy to see in the X-ray pictures. Sigmoidoscopy. A thin, flexible tube (sigmoidoscope) is placed into your rectum. The sigmoidoscope has a light and tiny camera in it. The caregiver uses the sigmoidoscope to look at the last third of your colon. Colonoscopy. This test is like sigmoidoscopy, but the caregiver looks at the entire colon. This is the most common method for finding and removing polyps. TREATMENT  Any polyps will be removed during a sigmoidoscopy or colonoscopy. The polyps are then tested for cancer. PREVENTION  To help lower your risk of getting more colon polyps: Eat plenty of fruits and vegetables. Avoid eating fatty foods. Do not smoke. Avoid drinking alcohol. Exercise every day. Lose weight if recommended by your caregiver. Eat plenty of calcium and folate. Foods that are rich in calcium include milk, cheese, and broccoli. Foods that are rich in folate include chickpeas, kidney beans, and spinach. HOME CARE INSTRUCTIONS Keep all follow-up appointments as directed by your caregiver. You may need periodic exams to check for polyps. SEEK MEDICAL CARE IF: You notice bleeding during a bowel movement.   This information is not intended to replace advice given to you by your health care provider. Make sure you discuss any questions you have with your health care provider.   Document Released: 01/01/2004 Document Revised: 04/27/2014 Document Reviewed: 06/16/2011 Elsevier Interactive Patient Education [employer].

## 2015-09-26 NOTE — Transfer of Care (Signed)
Immediate Anesthesia Transfer of Care Note  Patient: Matthew Vance  Procedure(s) Performed: Procedure(s) with comments: COLONOSCOPY WITH PROPOFOL (N/A) - 730  Patient Location: PACU  Anesthesia Type:MAC  Level of Consciousness: awake and alert   Airway & Oxygen Therapy: Patient Spontanous Breathing and Patient connected to face mask oxygen  Post-op Assessment: Report given to RN  Post vital signs: Reviewed and stable  Last Vitals:  Filed Vitals:   09/26/15 0745 09/26/15 0750  BP: 125/70 130/61  Temp:    Resp: 16 20    Last Pain:  Filed Vitals:   09/26/15 0752  PainSc: 4          Complications: No apparent anesthesia complications

## 2015-09-26 NOTE — Interval H&P Note (Signed)
History and Physical Interval Note:  09/26/2015 7:34 AM  Matthew Vance  has presented today for surgery, with the diagnosis of RECTAL BLEEDING/DIARRHEA/WEIGHT LOSS  The various methods of treatment have been discussed with the patient and family. After consideration of risks, benefits and other options for treatment, the patient has consented to  Procedure(s) with comments: COLONOSCOPY WITH PROPOFOL (N/A) - 730 as a surgical intervention .  The patient's history has been reviewed, patient examined, no change in status, stable for surgery.  I have reviewed the patient's chart and labs.  Questions were answered to the patient's satisfaction.     Matthew Vance  No change; ileo-colonoscopy per plan. The risks, benefits, limitations, alternatives and imponderables have been reviewed with the patient. Questions have been answered. All parties are agreeable.

## 2015-09-26 NOTE — H&P (View-Only) (Signed)
Primary Care Physician:  Gar Ponto, MD  Primary Gastroenterologist:  Garfield Cornea, MD   Chief Complaint  Patient presents with  . Rectal Bleeding  . Abdominal Pain  . Nausea  . Emesis  . Diarrhea    HPI:  Matthew Benzie is a 35 y.o. male here for semiurgent office visit for further evaluation of ongoing abdominal pain, vomiting, diarrhea, rectal bleeding. Dr. Quillian Quince called and spoke with Dr. Gala Romney requesting patient to be seen regarding the above matters. He has had an extensive evaluation including hospitalization but ongoing symptoms.  Patient states that he's been having abdominal cramping/right sided abdominal pain associated with nausea and intermittent vomiting, diarrhea fairly persistent this year. Similar symptoms which led to hospitalization March 2015 but states since that admission he has had more frequent and severe episodes.   Back in February symptoms became severe. Initially underwent a colonoscopy with Dr. Britta Mccreedy.  Reported to be normal. Small bowel follow-through was normal. Next he underwent a HIDA scan which was abnormal. Cholecystectomy performed, noted to have cholelithiasis with chronic cholecystitis. No improvement in symptoms. In March he had an EGD which showed mild gastritis. MRCP was negative. Multiple CT scans as outlined below.   Earlier this month he had episode of bright red blood per rectum, passed blood on multiple occasions. Described as moderate volume.  Complained of nocturnal diarrhea. 7-8 loose stools daily. No solid stools. Stool studies were negative for C. difficile amongst other pathogens. See below for details. Continues to have right-sided abdominal pain, constant. No recent vomiting. Overall his lost about 60 pounds since February. Denies heartburn.     CT abdomen and pelvis without contrast at [facility] on 09/03/2015 Impression: Small nonobstructing right renal calculi. No hydronephrosis. Small fat-containing umbilical  hernia.  MRI/MRCP at [facility] on 06/28/2015 Impression: Post cholecystectomy. Normal liver and biliary tree. Normal pancreas on noncontrast exam.  CT of the abdomen and pelvis with contrast at [facility] on 06/26/2015 Impression: Stable or slightly improved slight stranding in the cholecystectomy bed, likely related to recent postoperative state. No measurable fluid collection.  CT of the abdomen and pelvis with contrast at [facility] on 06/17/2015 Impression: Post cholecystectomy. There is a small amount of fluid and stranding around the cholecystectomy bed in the hepatic flexure. As likely represents expected postoperative changes. Nonobstructing right kidney stones.  Small bowel follow-through at [facility] on 06/06/2015 Impression normal small bowel follow-through study.  CT abdomen and pelvis without contrast at [facility] on 05/21/2015 Impression: Bilateral nonobstructing nephrolithiasis. No hydronephrosis. Small fat-containing umbilical hernia.  Current Outpatient Prescriptions  Medication Sig Dispense Refill  . busPIRone (BUSPAR) 15 MG tablet Take by mouth.    . dicyclomine (BENTYL) 20 MG tablet Take 20 mg by mouth daily.    Marland Kitchen FLUoxetine (PROZAC) 20 MG capsule Take 20 mg by mouth at bedtime.    Marland Kitchen lisinopril-hydrochlorothiazide (PRINZIDE,ZESTORETIC) 10-12.5 MG tablet Take 1 tablet by mouth daily.    . ondansetron (ZOFRAN) 4 MG tablet Take 4 mg by mouth every 8 (eight) hours as needed for nausea or vomiting.    . Oxycodone-Acetaminophen (PERCOCET PO) Take by mouth as needed.    . pantoprazole (PROTONIX) 40 MG tablet Take by mouth.     No current facility-administered medications for this visit.    Allergies as of 09/11/2015  . (No Known Allergies)    Past Medical History  Diagnosis Date  . Hypertension     Past Surgical History  Procedure Laterality Date  . Cholecystectomy  06/14/15    Dr.  Karlyn Agee: path: cholelithiasis with chronic cholecystitis  . Knee surgery    . Colonoscopy  06/04/15    Dr. Doristine Mango: normal  . Esophagogastroduodenoscopy  06/27/15    Dr. Karlyn Agee: gastritis    Family History  Problem Relation Age of Onset  . Irritable bowel syndrome Sister   . Crohn's disease Neg Hx   . Colitis Maternal Aunt     had colostomy  . Irritable bowel syndrome Paternal Grandmother   . Colon cancer Neg Hx   . Celiac disease Neg Hx     Social History   Social History  . Marital Status: Single    Spouse Name: N/A  . Number of Children: 2  . Years of Education: N/A   Occupational History  . [employer]    Social History Main Topics  . Smoking status: Never Smoker   . Smokeless tobacco: Not on file  . Alcohol Use: 0.0 oz/week    0 Standard drinks or equivalent per week     Comment: rare  . Drug Use: Not on file  . Sexual Activity: Not on file   Other Topics Concern  . Not on file   Social History Narrative      ROS:  General: Negative for anorexia,   fever, chills, fatigue, weakness. See hpi. Eyes: Negative for vision changes.  ENT: Negative for hoarseness, difficulty swallowing , nasal congestion. CV: Negative for chest pain, angina, palpitations, dyspnea on exertion, peripheral edema.  Respiratory: Negative for dyspnea at rest, dyspnea on exertion, cough, sputum, wheezing.  GI: See history of present illness. GU:  Negative for dysuria, hematuria, urinary incontinence, urinary frequency, nocturnal urination.  MS: Negative for joint pain, low back pain.  Derm: Negative for rash or itching.  Neuro: Negative for weakness, abnormal sensation, seizure, frequent headaches, memory loss, confusion.  Psych: Negative for anxiety, depression, suicidal ideation, hallucinations.  Endo: see hpi Heme: Negative for bruising or bleeding. Allergy: Negative for rash or hives.    Physical Examination:  BP 137/94 mmHg  Pulse 85  Temp(Src)  97.5 F (36.4 C)  Ht 6\' 4"  (1.93 m)  Wt 307 lb 9.6 oz (139.526 kg)  BMI 37.46 kg/m2   General: Well-nourished, well-developed in no acute distress. Accompanied by mother. Head: Normocephalic, atraumatic.   Eyes: Conjunctiva pink, no icterus. Mouth: Oropharyngeal mucosa moist and pink , no lesions erythema or exudate. Neck: Supple without thyromegaly, masses, or lymphadenopathy.  Lungs: Clear to auscultation bilaterally.  Heart: Regular rate and rhythm, no murmurs rubs or gallops.  Abdomen: Bowel sounds are normal, moderate right sided tenderness (mid-to-lower), nondistended, no hepatosplenomegaly or masses, no abdominal bruits or    hernia , no rebound or guarding.   Rectal: not performed Extremities: No lower extremity edema. No clubbing or deformities.  Neuro: Alert and oriented x 4 , grossly normal neurologically.  Skin: Warm and dry, no rash or jaundice.   Psych: Alert and cooperative, normal mood and affect.  Labs: Lab Results  Component Value Date   WBC 8.5 09/04/2015   HGB 13.2 09/04/2015   HCT 38.5* 09/04/2015   MCV 91.0 09/04/2015   PLT 212 09/04/2015   Lab Results  Component Value Date   CREATININE 0.83 09/04/2015   BUN 16 09/04/2015   NA 137 09/04/2015   K 3.8 09/04/2015   CL 105 09/04/2015   CO2 25 09/04/2015   Lab Results  Component Value Date  ALT 15* 09/04/2015   AST 17 09/04/2015   ALKPHOS 67 09/04/2015   BILITOT 0.4 09/04/2015   Lipase 20.7 on 09/04/2015                Labs from 06/26/2015 white blood cell count 9.8, hemoglobin 14.3, platelets 229,000, eosinophils 0.4%, urine drug screen negative. H pylori negative. Lead none detected. Arsenic 9, mercury 1.5  09/03/2015 Stool culture negative, Campylobacter stool antigen negative, C. difficile negative, Enterohemorrhagic Escherichia coli negative

## 2015-09-26 NOTE — Op Note (Signed)
[facility] Patient Name: Matthew Vance Procedure Date: 09/26/2015 7:22 AM MRN: [id number] Date of Birth: 01/08/1981 Attending MD: Norvel Richards , MD CSN: [id number] Age: 35 Admit Type: Outpatient Procedure:                Ileo-colonoscopy with snare poly, biopsy Indications:              Hematochezia, Weight loss, right-sided abdominal                            pain, chronic diarrhea Providers:                Norvel Richards, MD, Gwenlyn Fudge, RN, Randa Spike, Technician Referring MD:              Medicines:                Propofol per Anesthesia Complications:            No immediate complications. Estimated Blood Loss:     Estimated blood loss was minimal. Procedure:                Pre-Anesthesia Assessment:                           - Prior to the procedure, a History and Physical                            was performed, and patient medications and                            allergies were reviewed. The patient's tolerance of                            previous anesthesia was also reviewed. The risks                            and benefits of the procedure and the sedation                            options and risks were discussed with the patient.                            All questions were answered, and informed consent                            was obtained. ASA Grade Assessment: II - A patient                            with mild systemic disease. After reviewing the                            risks and benefits, the patient was deemed in  satisfactory condition to undergo the procedure.                           After obtaining informed consent, the colonoscope                            was passed under direct vision. Throughout the                            procedure, the patient's blood pressure, pulse, and                            oxygen saturations were monitored continuously. The                     EC-[id number] scope was introduced through                            the anus and advanced to the 15 cm into the ileum.                            The colonoscopy was performed without difficulty.                            The patient tolerated the procedure well. The                            quality of the bowel preparation was adequate. The                            terminal ileum, ileocecal valve, appendiceal                            orifice, and rectum were photographed. Scope In: 8:02:52 AM Scope Out: 8:31:30 AM Scope Withdrawal Time: 0 hours 23 minutes 40 seconds  Total Procedure Duration: 0 hours 28 minutes 38 seconds  Findings:      The perianal and digital rectal examinations were normal.      A 5 mm polyp was found in the hepatic flexure. The polyp was       semi-pedunculated. The polyp was removed with a cold snare. Resection       and retrieval were complete. Estimated blood loss was minimal.      The remainder of the colonic and rectal mucosa appeared normal..       Segmental biopsies of the right and left colon taken for histologic       study. The distal 15 cm of terminal ileal mucosa was inspected. There       were subtle "geographic" erosions in this segment of the GI tract. No       ulcers. No tumor seen. Biopsies of this segment taken as well.      Internal hemorrhoids were found during retroflexion. The hemorrhoids       were Grade I (internal hemorrhoids that do not prolapse). Impression:               - One 5 mm polyp at the hepatic flexure, removed  with a cold snare. Resected and retrieved.                            Remainder of colonic mucosa appeared normal?"status                            post segmental biopsy.                           - Abnormal Mucosa in the terminal ileum of                            uncertain significance - This was subtle. It may or                            may not be  clinically significant.. Biopsied.                           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 Moderate Sedation:      Moderate (conscious) sedation was personally administered by an       anesthesia professional. The following parameters were monitored: oxygen       saturation, heart rate, blood pressure, respiratory rate, EKG, adequacy       of pulmonary ventilation, and response to care. Total physician       intraservice time was 34 minutes. Recommendation:           - Patient has a contact number available for                            emergencies. The signs and symptoms of potential                            delayed complications were discussed with the                            patient. Return to normal activities tomorrow.                            Written discharge instructions were provided to the                            patient.                           - Advance diet as tolerated.                           - Continue present medications.                           -  Await pathology results.                           - Repeat colonoscopy date to be determined after                            pending pathology results are reviewed for                            surveillance based on pathology results.                           - Return to GI clinic after studies are complete.                            Patient and patient's mother note loud snoring.                            Patient states he only sleeps about 1 hour every                            night. I recommend they see Dr. Quillian Quince for                            consideration of a sleep study. Procedure Code(s):        --- Professional ---                           [phone], Colonoscopy, flexible; with removal of                            tumor(s), polyp(s), or other lesion(s) by  snare                            technique                           45380, 39, Colonoscopy, flexible; with biopsy,                            single or multiple Diagnosis Code(s):        --- Professional ---                           D12.3, Benign neoplasm of transverse colon (hepatic                            flexure or splenic flexure)                           K63.89, Other specified diseases of intestine                           K64.0, First degree hemorrhoids  K92.1, Melena (includes Hematochezia)                           R63.4, Abnormal weight loss CPT copyright 2016 American Medical Association. All rights reserved. The codes documented in this report are preliminary and upon coder review may  be revised to meet current compliance requirements. Cristopher Estimable. Dallana Mavity, MD Norvel Richards, MD 09/26/2015 9:09:07 AM This report has been signed electronically. Number of Addenda: 0

## 2015-09-26 NOTE — Anesthesia Postprocedure Evaluation (Signed)
Anesthesia Post Note  Patient: Matthew Vance  Procedure(s) Performed: Procedure(s) (LRB): COLONOSCOPY WITH PROPOFOL (N/A)  Patient location during evaluation: PACU Anesthesia Type: MAC Level of consciousness: awake and alert Pain management: pain level controlled Vital Signs Assessment: post-procedure vital signs reviewed and stable Respiratory status: spontaneous breathing Cardiovascular status: blood pressure returned to baseline Postop Assessment: no signs of nausea or vomiting Anesthetic complications: no    Last Vitals:  Filed Vitals:   09/26/15 0745 09/26/15 0750  BP: 125/70 130/61  Temp:    Resp: 16 20    Last Pain:  Filed Vitals:   09/26/15 0752  PainSc: 4                  [name]

## 2015-09-30 ENCOUNTER — Other Ambulatory Visit (HOSPITAL_COMMUNITY): Payer: PRIVATE HEALTH INSURANCE

## 2015-10-01 ENCOUNTER — Encounter: Payer: Self-pay | Admitting: Internal Medicine

## 2015-10-01 ENCOUNTER — Telehealth: Payer: Self-pay

## 2015-10-01 ENCOUNTER — Encounter (HOSPITAL_COMMUNITY): Payer: Self-pay | Admitting: Internal Medicine

## 2015-10-01 NOTE — Telephone Encounter (Signed)
Letter mailed to the pt. 

## 2015-10-01 NOTE — Telephone Encounter (Signed)
Per RMR- Send letter to patient.  Send copy of letter with path to referring provider and PCP.    Patient is one month follow-up with [facility]

## 2015-10-01 NOTE — Telephone Encounter (Signed)
PATIENT SCHEDULED FOR FU OV

## 2015-10-10 ENCOUNTER — Telehealth: Payer: Self-pay

## 2015-10-10 NOTE — Telephone Encounter (Signed)
Pt's grandmother is calling because he started hurting in his stomach again. Like knifes was stabbing him. He took two bentyl and that did not help. Walking to floors last night with pain. No n/v is sever pain in the stomach and side but he did go to work this morning since he has missed so much. Please advise

## 2015-10-10 NOTE — Telephone Encounter (Signed)
Pt is aware that we have no openings for next week, but we do have a cancellation for tomorrow at 0830. Pt agreed to come in tomorrow. OV for July was cancelled

## 2015-10-10 NOTE — Telephone Encounter (Signed)
Matthew Vance, please schedule ov

## 2015-10-10 NOTE — Telephone Encounter (Signed)
I think he should be seen in the office next week

## 2015-10-11 ENCOUNTER — Ambulatory Visit (INDEPENDENT_AMBULATORY_CARE_PROVIDER_SITE_OTHER): Payer: PRIVATE HEALTH INSURANCE | Admitting: Gastroenterology

## 2015-10-11 ENCOUNTER — Encounter: Payer: Self-pay | Admitting: Gastroenterology

## 2015-10-11 VITALS — BP 135/91 | HR 82 | Temp 97.0°F | Ht 76.0 in | Wt 310.6 lb

## 2015-10-11 DIAGNOSIS — R1031 Right lower quadrant pain: Secondary | ICD-10-CM

## 2015-10-11 DIAGNOSIS — R112 Nausea with vomiting, unspecified: Secondary | ICD-10-CM | POA: Diagnosis not present

## 2015-10-11 DIAGNOSIS — A09 Infectious gastroenteritis and colitis, unspecified: Secondary | ICD-10-CM

## 2015-10-11 DIAGNOSIS — R634 Abnormal weight loss: Secondary | ICD-10-CM

## 2015-10-11 DIAGNOSIS — R1013 Epigastric pain: Secondary | ICD-10-CM

## 2015-10-11 DIAGNOSIS — K625 Hemorrhage of anus and rectum: Secondary | ICD-10-CM

## 2015-10-11 DIAGNOSIS — R197 Diarrhea, unspecified: Secondary | ICD-10-CM

## 2015-10-11 MED ORDER — HYDROCODONE-ACETAMINOPHEN 5-325 MG PO TABS: 1.0000 | ORAL_TABLET | ORAL | Status: DC | PRN

## 2015-10-11 NOTE — Progress Notes (Signed)
Primary Care Physician: Gar Ponto, MD  Primary Gastroenterologist:  Garfield Cornea, MD   Chief Complaint  Patient presents with  . Follow-up  . Abdominal Pain    HPI: Matthew Vance is a 35 y.o. male here for urgent follow up visit for abdominal pain. Extensive evaluation summarized as below.   Since his initial visit with [place] last month, he underwent a colonoscopy. He had a 5 mm polyp removed from the hepatic flexure, tubular adenoma. Segmental biopsies from the: Unremarkable. Abnormal mucosa in the TI of uncertain significance, quite subtle, biopsies unremarkable. Internal hemorrhoids.   Past out at work last Tuesday. Was taken to [facility], labs performed. Patient states he was dehydrated. Notes that he has some mild abdominal pain that time. 2 nights ago however he came home from work and developed severe abdominal pain described as crampy in nature. Pain usually begins the epigastrium and radiates down in the right lower quadrant. Associated with vomiting. Severe episodes occur about once per week. He has daily abdominal pain however. Continues to have multiple loose stools daily, 7-8. Describes seeing ongoing primary blood per rectum which she feels like his a lot. No blood clots or melena. No heartburn. Wednesday he took 2  Bentyl without relief. Patient lives with his grandmother and 2 children ages 33 and 19. Strenuous job for the [employer]. Works in the heat.     CT abdomen and pelvis without contrast at [facility] on 09/03/2015 Impression: Small nonobstructing right renal calculi. No hydronephrosis. Small fat-containing umbilical hernia.  MRI/MRCP at [facility] on 06/28/2015 Impression: Post cholecystectomy. Normal liver and biliary tree. Normal pancreas on noncontrast exam.  CT of the abdomen and pelvis with contrast at [facility] on 06/26/2015 Impression: Stable or slightly improved slight stranding in  the cholecystectomy bed, likely related to recent postoperative state. No measurable fluid collection.  CT of the abdomen and pelvis with contrast at [facility] on 06/17/2015 Impression: Post cholecystectomy. There is a small amount of fluid and stranding around the cholecystectomy bed in the hepatic flexure. As likely represents expected postoperative changes. Nonobstructing right kidney stones.  Small bowel follow-through at [facility] on 06/06/2015 Impression normal small bowel follow-through study.  CT abdomen and pelvis without contrast at [facility] on 05/21/2015 Impression: Bilateral nonobstructing nephrolithiasis. No hydronephrosis. Small fat-containing umbilical hernia.    Current Outpatient Prescriptions  Medication Sig Dispense Refill  . busPIRone (BUSPAR) 15 MG tablet Take 15 mg by mouth daily.     Marland Kitchen dicyclomine (BENTYL) 20 MG tablet Take 1 tablet by mouth 3 (three) times daily.  3  . lisinopril-hydrochlorothiazide (PRINZIDE,ZESTORETIC) 20-12.5 MG tablet Take 1 tablet by mouth daily.  1  . ondansetron (ZOFRAN) 4 MG tablet Take 4 mg by mouth every 8 (eight) hours as needed for nausea or vomiting.    . pantoprazole (PROTONIX) 40 MG tablet Take 40 mg by mouth daily.     . sucralfate (CARAFATE) 1 g tablet Take 1 g by mouth 4 (four) times daily.  0   No current facility-administered medications for this visit.    Allergies as of 10/11/2015  . (No Known Allergies)   Past Surgical History  Procedure Laterality Date  . Cholecystectomy  06/14/15    Dr. Karlyn Agee: path: cholelithiasis with chronic cholecystitis  . Knee surgery Right     arthroscopy x4  . Colonoscopy  06/04/15    Dr. Doristine Mango: normal  .  Esophagogastroduodenoscopy  06/27/15    Dr. Karlyn Agee: gastritis  . Colonoscopy with propofol N/A 09/26/2015    next tcs in 5 years for tubular adenoma. random colon bx negative. subtle muscosal changes of TI  with negative bx, int hemorrhoids.    Past Medical History  Diagnosis Date  . Hypertension   . GERD (gastroesophageal reflux disease)   . Anxiety    Family History  Problem Relation Age of Onset  . Irritable bowel syndrome Sister   . Crohn's disease Neg Hx   . Colitis Maternal Aunt     had colostomy  . Irritable bowel syndrome Paternal Grandmother   . Colon cancer Neg Hx   . Celiac disease Neg Hx    Social History   Social History  . Marital Status: Single    Spouse Name: N/A  . Number of Children: 2  . Years of Education: N/A   Occupational History  . [employer]    Social History Main Topics  . Smoking status: Never Smoker   . Smokeless tobacco: None  . Alcohol Use: 0.0 oz/week    0 Standard drinks or equivalent per week     Comment: rare  . Drug Use: No  . Sexual Activity: Yes    Birth Control/ Protection: None   Other Topics Concern  . None   Social History Narrative    ROS:  General: Negative for anorexia, weight loss, fever, chills, fatigue, weakness. Previous 60 pound weight loss but weight up 10 pounds in past one month ENT: Negative for hoarseness, difficulty swallowing , nasal congestion. CV: Negative for chest pain, angina, palpitations, dyspnea on exertion, peripheral edema.  Respiratory: Negative for dyspnea at rest, dyspnea on exertion, cough, sputum, wheezing.  GI: See history of present illness. GU:  Negative for dysuria, hematuria, urinary incontinence, urinary frequency, nocturnal urination.  Endo:weight up 10 pounds in one month.    Physical Examination:   BP 135/91 mmHg  Pulse 82  Temp(Src) 97 F (36.1 C)  Ht 6\' 4"  (1.93 m)  Wt 310 lb 9.6 oz (140.887 kg)  BMI 37.82 kg/m2  General: Well-nourished, well-developed in no acute distress.  Eyes: No icterus. Mouth: Oropharyngeal mucosa moist and pink , no lesions erythema or exudate. Lungs: Clear to auscultation bilaterally.  Heart: Regular rate and rhythm, no murmurs rubs or gallops.   Abdomen: Bowel sounds are normal, mild epig/rlq tenderness, nondistended, no hepatosplenomegaly or masses, no abdominal bruits or hernia , no rebound or guarding.   Extremities: No lower extremity edema. No clubbing or deformities. Neuro: Alert and oriented x 4   Skin: Warm and dry, no jaundice.   Psych: Alert and cooperative, normal mood and affect.  Labs:  Lab Results  Component Value Date   CREATININE 0.81 09/17/2015   BUN 14 09/17/2015   NA 136 09/17/2015   K 3.8 09/17/2015   CL 103 09/17/2015   CO2 25 09/17/2015   Lab Results  Component Value Date   ALT 17 09/17/2015   AST 15 09/17/2015   ALKPHOS 80 09/17/2015   BILITOT 0.4 09/17/2015   Lab Results  Component Value Date   WBC 10.5 09/17/2015   HGB 14.1 09/17/2015   HCT 40.5 09/17/2015   MCV 90.6 09/17/2015   PLT 222 09/17/2015     Imaging Studies: No results found.   Impression/plan:  35 year old gentleman who presents for urgent office visit for ongoing abdominal pain associated with vomiting, diarrhea, rectal bleeding. Extensive evaluation as outlined above. No improvement  of symptoms status post cholecystectomy in February. EGD and colonoscopy essentially unremarkable prior to coming to see [place] last month. Repeat colonoscopy with terminal ileoscopy again essentially unremarkable. There was subtle abnormal appearance of the terminal ileum but nothing diagnostic via biopsy. Patient has had multiple imaging studies as outlined above. Celiac disease screening negative. Patient continues to have severe episodic abdominal pain associated with vomiting sometimes syncope. Continues to have more daily mild abdominal pain associated with diarrhea. We need to check heavy metal screening. 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 Discussed with Dr. Gala Romney, if unremarkable, next step would be tertiary care referral. Patient is agreeable to plan. I did provide him with Vicodin 5/25 mg to take as needed for severe pain only, when he is  at home. Suggest maintaining hydration especially with his line of work and the heat. He will continue PPI therapy as before. Further recommendations to follow.

## 2015-10-11 NOTE — Patient Instructions (Signed)
1. Please have your labs done as soon as possible. 2. Referral process started for GI at [facility].

## 2015-10-14 ENCOUNTER — Other Ambulatory Visit: Payer: Self-pay

## 2015-10-14 DIAGNOSIS — R634 Abnormal weight loss: Secondary | ICD-10-CM

## 2015-10-14 DIAGNOSIS — R1031 Right lower quadrant pain: Secondary | ICD-10-CM

## 2015-10-14 NOTE — Progress Notes (Signed)
CC'ED TO PCP 

## 2015-10-15 LAB — CBC WITH DIFFERENTIAL/PLATELET
BASOS ABS: 0 {cells}/uL (ref 0–200)
Basophils Relative: 0 %
EOS ABS: 74 {cells}/uL (ref 15–500)
EOS PCT: 1 %
HCT: 41.4 % (ref 38.5–50.0)
HEMOGLOBIN: 14.3 g/dL (ref 13.2–17.1)
LYMPHS ABS: 1406 {cells}/uL (ref 850–3900)
Lymphocytes Relative: 19 %
MCH: 31.6 pg (ref 27.0–33.0)
MCHC: 34.5 g/dL (ref 32.0–36.0)
MCV: 91.6 fL (ref 80.0–100.0)
MONOS PCT: 5 %
MPV: 9.9 fL (ref 7.5–12.5)
Monocytes Absolute: 370 cells/uL (ref 200–950)
NEUTROS PCT: 75 %
Neutro Abs: 5550 cells/uL (ref 1500–7800)
Platelets: 227 10*3/uL (ref 140–400)
RBC: 4.52 MIL/uL (ref 4.20–5.80)
RDW: 13.8 % (ref 11.0–15.0)
WBC: 7.4 10*3/uL (ref 3.8–10.8)

## 2015-10-15 LAB — COMPREHENSIVE METABOLIC PANEL
ALBUMIN: 4 g/dL (ref 3.6–5.1)
ALT: 13 U/L (ref 9–46)
AST: 13 U/L (ref 10–40)
Alkaline Phosphatase: 78 U/L (ref 40–115)
BUN: 11 mg/dL (ref 7–25)
CHLORIDE: 104 mmol/L (ref 98–110)
CO2: 21 mmol/L (ref 20–31)
CREATININE: 0.79 mg/dL (ref 0.60–1.35)
Calcium: 8.8 mg/dL (ref 8.6–10.3)
Glucose, Bld: 144 mg/dL — ABNORMAL HIGH (ref 65–99)
POTASSIUM: 3.6 mmol/L (ref 3.5–5.3)
SODIUM: 136 mmol/L (ref 135–146)
TOTAL PROTEIN: 7 g/dL (ref 6.1–8.1)
Total Bilirubin: 0.4 mg/dL (ref 0.2–1.2)

## 2015-10-15 LAB — CREATININE, URINE, RANDOM: CREATININE, URINE: 175 mg/dL (ref 20–370)

## 2015-10-15 LAB — LIPASE: Lipase: 12 U/L (ref 7–60)

## 2015-10-17 LAB — PORPHOBILINOGEN, 24 HR URINE-QUANT
PORPHOBILINOGEN, 24 HR UR: 1.1 mg/(24.h) (ref <2.4)
TOTAL VOLUME - PORPQ: 550 mL

## 2015-10-24 LAB — PORPHYRINS, FRACTIONATED, RANDOM URINE
COPROPORPHYRIN I: 29.3 ug/g{creat} — AB (ref 5.6–28.6)
COPROPORPHYRIN III (PORFRU): 52.5 ug/g{creat} (ref 4.1–76.4)
PENTACARBOXYPORPHYRIN: 0.6 ug/g{creat} (ref <=3.5)
TOTAL PORPHYRINS: 102.5 mcg/g creat (ref 23.3–132.4)
Uroporphyrin I: 16.6 mcg/g creat (ref 3.1–18.2)
Uroporphyrin III (PORFRU): 3.5 mcg/g creat (ref <=4.8)

## 2015-10-30 ENCOUNTER — Emergency Department (HOSPITAL_COMMUNITY)
Admission: EM | Admit: 2015-10-30 | Discharge: 2015-10-30 | Disposition: A | Discharge status: Home / Self Care | Payer: PRIVATE HEALTH INSURANCE | Attending: Emergency Medicine | Admitting: Emergency Medicine

## 2015-10-30 ENCOUNTER — Encounter (HOSPITAL_COMMUNITY): Payer: Self-pay | Admitting: Emergency Medicine

## 2015-10-30 DIAGNOSIS — N139 Obstructive and reflux uropathy, unspecified: Secondary | ICD-10-CM | POA: Insufficient documentation

## 2015-10-30 DIAGNOSIS — R109 Unspecified abdominal pain: Secondary | ICD-10-CM | POA: Diagnosis present

## 2015-10-30 DIAGNOSIS — I10 Essential (primary) hypertension: Secondary | ICD-10-CM | POA: Insufficient documentation

## 2015-10-30 DIAGNOSIS — Z79899 Other long term (current) drug therapy: Secondary | ICD-10-CM | POA: Insufficient documentation

## 2015-10-30 HISTORY — DX: Disorder of kidney and ureter, unspecified: N28.9

## 2015-10-30 LAB — CBC WITH DIFFERENTIAL/PLATELET
BASOS PCT: 0 %
Basophils Absolute: 0 10*3/uL (ref 0.0–0.1)
EOS ABS: 0.1 10*3/uL (ref 0.0–0.7)
Eosinophils Relative: 1 %
HEMATOCRIT: 36.2 % — AB (ref 39.0–52.0)
Hemoglobin: 12.7 g/dL — ABNORMAL LOW (ref 13.0–17.0)
LYMPHS ABS: 2.2 10*3/uL (ref 0.7–4.0)
Lymphocytes Relative: 29 %
MCH: 31.4 pg (ref 26.0–34.0)
MCHC: 35.1 g/dL (ref 30.0–36.0)
MCV: 89.6 fL (ref 78.0–100.0)
MONO ABS: 0.6 10*3/uL (ref 0.1–1.0)
MONOS PCT: 8 %
NEUTROS ABS: 4.5 10*3/uL (ref 1.7–7.7)
Neutrophils Relative %: 62 %
Platelets: 193 10*3/uL (ref 150–400)
RBC: 4.04 MIL/uL — ABNORMAL LOW (ref 4.22–5.81)
RDW: 12.6 % (ref 11.5–15.5)
WBC: 7.4 10*3/uL (ref 4.0–10.5)

## 2015-10-30 LAB — URINALYSIS, ROUTINE W REFLEX MICROSCOPIC
BILIRUBIN URINE: NEGATIVE
GLUCOSE, UA: NEGATIVE mg/dL
HGB URINE DIPSTICK: NEGATIVE
KETONES UR: NEGATIVE mg/dL
Leukocytes, UA: NEGATIVE
NITRITE: NEGATIVE
PH: 6 (ref 5.0–8.0)
PROTEIN: NEGATIVE mg/dL
SPECIFIC GRAVITY, URINE: 1.02 (ref 1.005–1.030)

## 2015-10-30 LAB — BASIC METABOLIC PANEL
Anion gap: 6 (ref 5–15)
BUN: 10 mg/dL (ref 6–20)
CALCIUM: 8.4 mg/dL — AB (ref 8.9–10.3)
CO2: 26 mmol/L (ref 22–32)
CREATININE: 0.77 mg/dL (ref 0.61–1.24)
Chloride: 106 mmol/L (ref 101–111)
GFR calc non Af Amer: >60 mL/min (ref >60)
Glucose, Bld: 88 mg/dL (ref 65–99)
Potassium: 3.9 mmol/L (ref 3.5–5.1)
Sodium: 138 mmol/L (ref 135–145)

## 2015-10-30 MED ORDER — TAMSULOSIN HCL 0.4 MG PO CAPS: 0.4000 mg | ORAL_CAPSULE | Freq: Every day | ORAL | Status: DC

## 2015-10-30 NOTE — ED Notes (Signed)
247 mL found on Bladder scanner

## 2015-10-30 NOTE — ED Notes (Signed)
Pt's wife came out stating that pt did not want to be discharged with Foley. Dr. Audie Pinto notified. Will d/c Foley.

## 2015-10-30 NOTE — ED Notes (Signed)
EDP at bedside updating patient and family. 

## 2015-10-30 NOTE — ED Notes (Addendum)
PT c/o right sided flank pain that started today with hx of kidney stones. PT also states urinary retention x2 days. PT states he had CT scan at [facility] ED today and was discharged home. PT requesting urine and blood work.

## 2015-10-30 NOTE — Discharge Instructions (Signed)
Foley Catheter Care, Adult °A Foley catheter is a soft, flexible tube that is placed into the bladder to drain urine. A Foley catheter may be inserted if: °· You leak urine or are not able to control when you urinate (urinary incontinence). °· You are not able to urinate when you need to (urinary retention). °· You had prostate surgery or surgery on the genitals. °· You have certain medical conditions, such as multiple sclerosis, dementia, or a spinal cord injury. °If you are going home with a Foley catheter in place, follow the instructions below. °TAKING CARE OF THE CATHETER °1. Wash your hands with soap and water. °2. Using mild soap and warm water on a clean washcloth: °¨ Clean the area on your body closest to the catheter insertion site using a circular motion, moving away from the catheter. Never wipe toward the catheter because this could sweep bacteria up into the urethra and cause infection. °¨ Remove all traces of soap. Pat the area dry with a clean towel. For males, reposition the foreskin. °3. Attach the catheter to your leg so there is no tension on the catheter. Use adhesive tape or a leg strap. If you are using adhesive tape, remove any sticky residue left behind by the previous tape you used. °4. Keep the drainage bag below the level of the bladder, but keep it off the floor. °5. Check throughout the day to be sure the catheter is working and urine is draining freely. Make sure the tubing does not become kinked. °6. Do not pull on the catheter or try to remove it. Pulling could damage internal tissues. °TAKING CARE OF THE DRAINAGE BAGS °You will be given two drainage bags to take home. One is a large overnight drainage bag, and the other is a smaller leg bag that fits underneath clothing. You may wear the overnight bag at any time, but you should never wear the smaller leg bag at night. Follow the instructions below for how to empty, change, and clean your drainage bags. °Emptying the Drainage  Bag °You must empty your drainage bag when it is  -½ full or at least 2-3 times a day. °1. Wash your hands with soap and water. °2. Keep the drainage bag below your hips, below the level of your bladder. This stops urine from going back into the tubing and into your bladder. °3. Hold the dirty bag over the toilet or a clean container. °4. Open the pour spout at the bottom of the bag and empty the urine into the toilet or container. Do not let the pour spout touch the toilet, container, or any other surface. Doing so can place bacteria on the bag, which can cause an infection. °5. Clean the pour spout with a gauze pad or cotton ball that has rubbing alcohol on it. °6. Close the pour spout. °7. Attach the bag to your leg with adhesive tape or a leg strap. °8. Wash your hands well. °Changing the Drainage Bag °Change your drainage bag once a month or sooner if it starts to smell bad or look dirty. Below are steps to follow when changing the drainage bag. °1. Wash your hands with soap and water. °2. Pinch off the rubber catheter so that urine does not spill out. °3. Disconnect the catheter tube from the drainage tube at the connection valve. Do not let the tubes touch any surface. °4. Clean the end of the catheter tube with an alcohol wipe. Use a different alcohol wipe to clean   the end of the drainage tube. °5. Connect the catheter tube to the drainage tube of the clean drainage bag. °6. Attach the new bag to the leg with adhesive tape or a leg strap. Avoid attaching the new bag too tightly. °7. Wash your hands well. °Cleaning the Drainage Bag °1. Wash your hands with soap and water. °2. Wash the bag in warm, soapy water. °3. Rinse the bag thoroughly with warm water. °4. Fill the bag with a solution of white vinegar and water (1 cup vinegar to 1 qt warm water [.2 L vinegar to 1 L warm water]). Close the bag and soak it for 30 minutes in the solution. °5. Rinse the bag with warm water. °6. Hang the bag to dry with the  pour spout open and hanging downward. °7. Store the clean bag (once it is dry) in a clean plastic bag. °8. Wash your hands well. °PREVENTING INFECTION °· Wash your hands before and after handling your catheter. °· Take showers daily and wash the area where the catheter enters your body. Do not take baths. Replace wet leg straps with dry ones, if this applies. °· Do not use powders, sprays, or lotions on the genital area. Only use creams, lotions, or ointments as directed by your caregiver. °· For females, wipe from front to back after each bowel movement. °· Drink enough fluids to keep your urine clear or pale yellow unless you have a fluid restriction. °· Do not let the drainage bag or tubing touch or lie on the floor. °· Wear cotton underwear to absorb moisture and to keep your skin drier. °SEEK MEDICAL CARE IF:  °· Your urine is cloudy or smells unusually bad. °· Your catheter becomes clogged. °· You are not draining urine into the bag or your bladder feels full. °· Your catheter starts to leak. °SEEK IMMEDIATE MEDICAL CARE IF:  °· You have pain, swelling, redness, or pus where the catheter enters the body. °· You have pain in the abdomen, legs, lower back, or bladder. °· You have a fever. °· You see blood fill the catheter, or your urine is pink or red. °· You have nausea, vomiting, or chills. °· Your catheter gets pulled out. °MAKE SURE YOU:  °· Understand these instructions. °· Will watch your condition. °· Will get help right away if you are not doing well or get worse. °  °This information is not intended to replace advice given to you by your health care provider. Make sure you discuss any questions you have with your health care provider. °  °Document Released: 04/06/2005 Document Revised: 08/21/2013 Document Reviewed: 03/28/2012 °Elsevier Interactive Patient Education ©2016 [employer] ° °

## 2015-10-30 NOTE — ED Provider Notes (Signed)
CSN: [id number]     Arrival date & time 10/30/15  1215 History   First MD Initiated Contact with Patient 10/30/15 1310     Chief Complaint  Patient presents with  . Flank Pain      HPI PT c/o right sided flank pain that started today with hx of kidney stones. PT also states urinary retention x2 days. PT states he had CT scan at [facility] ED today and was discharged home. PT requesting urine and blood work. Past Medical History  Diagnosis Date  . Hypertension   . GERD (gastroesophageal reflux disease)   . Anxiety   . Renal disorder     kidney stone   Past Surgical History  Procedure Laterality Date  . Cholecystectomy  06/14/15    Dr. Karlyn Agee: path: cholelithiasis with chronic cholecystitis  . Knee surgery Right     arthroscopy x4  . Colonoscopy  06/04/15    Dr. Doristine Mango: normal  . Esophagogastroduodenoscopy  06/27/15    Dr. Karlyn Agee: gastritis  . Colonoscopy with propofol N/A 09/26/2015    next tcs in 5 years for tubular adenoma. random colon bx negative. subtle muscosal changes of TI with negative bx, int hemorrhoids.    Family History  Problem Relation Age of Onset  . Irritable bowel syndrome Sister   . Crohn's disease Neg Hx   . Colitis Maternal Aunt     had colostomy  . Irritable bowel syndrome Paternal Grandmother   . Colon cancer Neg Hx   . Celiac disease Neg Hx    Social History  Substance Use Topics  . Smoking status: Never Smoker   . Smokeless tobacco: None  . Alcohol Use: 0.0 oz/week    0 Standard drinks or equivalent per week     Comment: rare    Review of Systems  All other systems reviewed and are negative  Allergies  Toradol  Home Medications   Prior to Admission medications   Medication Sig Start Date End Date Taking? Authorizing Provider  dicyclomine (BENTYL) 20 MG tablet Take 1 tablet by mouth 3 (three) times daily. 09/17/15  Yes Historical Provider, MD  lisinopril-hydrochlorothiazide (PRINZIDE,ZESTORETIC) 20-12.5 MG  tablet Take 1 tablet by mouth daily. 09/04/15  Yes Historical Provider, MD  pantoprazole (PROTONIX) 40 MG tablet Take 40 mg by mouth daily.  06/27/15 01/22/16 Yes Historical Provider, MD  sucralfate (CARAFATE) 1 g tablet Take 1 g by mouth 4 (four) times daily. 09/04/15  Yes Historical Provider, MD  HYDROcodone-acetaminophen (NORCO/VICODIN) 5-325 MG tablet Take 1 tablet by mouth every 4 (four) hours as needed for moderate pain. Patient not taking: Reported on 10/30/2015 10/11/15   Mahala Menghini, PA-C  ondansetron (ZOFRAN) 4 MG tablet Take 4 mg by mouth every 8 (eight) hours as needed for nausea or vomiting.    Historical Provider, MD  tamsulosin (FLOMAX) 0.4 MG CAPS capsule Take 1 capsule (0.4 mg total) by mouth daily after supper. 10/30/15   Leonard Schwartz, MD   BP 112/72 mmHg  Pulse 92  Temp(Src) 98.6 F (37 C) (Oral)  Resp 16  Ht 6\' 4"  (1.93 m)  Wt 300 lb (136.079 kg)  BMI 36.53 kg/m2  SpO2 98% Physical Exam Physical Exam  Nursing note and vitals reviewed. Constitutional: He is oriented to person, place, and time. He appears well-developed and well-nourished. No distress.  HENT:  Head: Normocephalic and atraumatic.  Eyes: Pupils are equal, round, and reactive to light.  Neck: Normal range of motion.  Cardiovascular: Normal rate  and intact distal pulses.   Pulmonary/Chest: No respiratory distress.  Abdominal: Normal appearance. He exhibits distention and tenderness over the suprapubic area. Musculoskeletal: Normal range of motion.  Neurological: He is alert and oriented to person, place, and time. No cranial nerve deficit.  Skin: Skin is warm and dry. No rash noted.  Psychiatric: He has a normal mood and affect. His behavior is normal.   ED Course  Procedures (including critical care time)  Patient felt much improved after Foley was placed.  He drained approximately 320 cc of urine. Labs Review Labs Reviewed  CBC WITH DIFFERENTIAL/PLATELET - Abnormal; Notable for the following:     RBC 4.04 (*)    Hemoglobin 12.7 (*)    HCT 36.2 (*)    All other components within normal limits  BASIC METABOLIC PANEL - Abnormal; Notable for the following:    Calcium 8.4 (*)    All other components within normal limits  URINALYSIS, ROUTINE W REFLEX MICROSCOPIC (NOT AT [facility])    Imaging Review I reviewed medical records from [facility].  A CT scan of the abdomen was done on 628 which showed a nonobstructive ring 2 mm stone in the right collecting system.  No evidence of urinary obstruction was noted. I have personally reviewed and evaluated these images and lab results as part of my medical decision-making.  I discussed the case with urology.  They recommended that the patient leave the Foley in place and see them in the office on Friday.  Patient refused to have the Foley remain in place.  I will place him on Flomax and arrange for urology follow-up.  Patient invited to return the emergency room should he not be able to urinate in between now and when he sees the urologist.  MDM   Final diagnoses:  Urinary obstruction        Leonard Schwartz, MD 10/30/15 1536

## 2015-11-05 ENCOUNTER — Ambulatory Visit: Payer: PRIVATE HEALTH INSURANCE | Admitting: Gastroenterology

## 2015-11-17 ENCOUNTER — Emergency Department (HOSPITAL_COMMUNITY): Payer: PRIVATE HEALTH INSURANCE

## 2015-11-17 ENCOUNTER — Emergency Department (HOSPITAL_COMMUNITY)
Admission: EM | Admit: 2015-11-17 | Discharge: 2015-11-18 | Disposition: A | Discharge status: Home / Self Care | Payer: PRIVATE HEALTH INSURANCE | Attending: Emergency Medicine | Admitting: Emergency Medicine

## 2015-11-17 ENCOUNTER — Encounter (HOSPITAL_COMMUNITY): Payer: Self-pay

## 2015-11-17 DIAGNOSIS — R0602 Shortness of breath: Secondary | ICD-10-CM | POA: Insufficient documentation

## 2015-11-17 DIAGNOSIS — R200 Anesthesia of skin: Secondary | ICD-10-CM | POA: Insufficient documentation

## 2015-11-17 DIAGNOSIS — R0789 Other chest pain: Secondary | ICD-10-CM | POA: Diagnosis not present

## 2015-11-17 DIAGNOSIS — R110 Nausea: Secondary | ICD-10-CM | POA: Insufficient documentation

## 2015-11-17 DIAGNOSIS — I10 Essential (primary) hypertension: Secondary | ICD-10-CM | POA: Diagnosis not present

## 2015-11-17 DIAGNOSIS — R079 Chest pain, unspecified: Secondary | ICD-10-CM

## 2015-11-17 DIAGNOSIS — Z79899 Other long term (current) drug therapy: Secondary | ICD-10-CM | POA: Diagnosis not present

## 2015-11-17 LAB — CBC
HEMATOCRIT: 40.1 % (ref 39.0–52.0)
HEMOGLOBIN: 13.9 g/dL (ref 13.0–17.0)
MCH: 31.2 pg (ref 26.0–34.0)
MCHC: 34.7 g/dL (ref 30.0–36.0)
MCV: 90.1 fL (ref 78.0–100.0)
Platelets: 225 10*3/uL (ref 150–400)
RBC: 4.45 MIL/uL (ref 4.22–5.81)
RDW: 12.8 % (ref 11.5–15.5)
WBC: 10.5 10*3/uL (ref 4.0–10.5)

## 2015-11-17 LAB — BASIC METABOLIC PANEL
ANION GAP: 9 (ref 5–15)
BUN: 17 mg/dL (ref 6–20)
CHLORIDE: 104 mmol/L (ref 101–111)
CO2: 23 mmol/L (ref 22–32)
Calcium: 9.4 mg/dL (ref 8.9–10.3)
Creatinine, Ser: 0.89 mg/dL (ref 0.61–1.24)
GFR calc non Af Amer: >60 mL/min (ref >60)
Glucose, Bld: 111 mg/dL — ABNORMAL HIGH (ref 65–99)
POTASSIUM: 3.3 mmol/L — AB (ref 3.5–5.1)
Sodium: 136 mmol/L (ref 135–145)

## 2015-11-17 LAB — DIFFERENTIAL
BASOS PCT: 0 %
Basophils Absolute: 0 10*3/uL (ref 0.0–0.1)
EOS ABS: 0.1 10*3/uL (ref 0.0–0.7)
Eosinophils Relative: 1 %
LYMPHS ABS: 2.7 10*3/uL (ref 0.7–4.0)
LYMPHS PCT: 26 %
MONO ABS: 0.9 10*3/uL (ref 0.1–1.0)
Monocytes Relative: 9 %
NEUTROS ABS: 6.6 10*3/uL (ref 1.7–7.7)
Neutrophils Relative %: 64 %

## 2015-11-17 LAB — TROPONIN I

## 2015-11-17 MED ORDER — ONDANSETRON HCL 4 MG/2ML IJ SOLN
4.0000 mg | Freq: Once | INTRAMUSCULAR | Status: AC
  Administered 2015-11-17: 4 mg via INTRAVENOUS
  Filled 2015-11-17: qty 2

## 2015-11-17 MED ORDER — MORPHINE SULFATE (PF) 4 MG/ML IV SOLN
4.0000 mg | Freq: Once | INTRAVENOUS | Status: AC
  Administered 2015-11-17: 4 mg via INTRAVENOUS
  Filled 2015-11-17: qty 1

## 2015-11-17 NOTE — ED Triage Notes (Signed)
Central chest pain started at 2130. SOB and nausea associated. Patient also c/o arm numbness.

## 2015-11-17 NOTE — ED Provider Notes (Signed)
[place] DEPT Provider Note   CSN: [id number] Arrival date & time: 11/17/15  2222  First Provider Contact:  First MD Initiated Contact with Patient 11/17/15 2322        History   Chief Complaint Chief Complaint  Patient presents with  . Chest Pain    HPI Matthew Vance is a 35 y.o. male.  HPI   Matthew Vance is a 35 y.o. male with hx of GERD, anxiety, and HTN who presents to the Emergency Department complaining of left sided chest pain that began while working at home.  Symptoms began at 9:30 pm as sharp pain to the left upper chest that radiates to the left neck and associated with nausea, shortness of breath, and intermittent "tingling" to fingers of both hands earlier today.  He states that he has had similar episodes earlier this year that was diagnosed as "inflammation of his chest wall" after having gallbladder surgery.  He denies illicit drug use, fever, abdominal pain, vomiting.     Past Medical History:  Diagnosis Date  . Anxiety   . GERD (gastroesophageal reflux disease)   . Hypertension   . Renal disorder    kidney stone    Patient Active Problem List   Diagnosis Date Noted  . RLQ abdominal pain 10/11/2015  . History of colonic polyps   . Abnormal computed tomography of cecum and terminal ileum   . Abnormal weight loss 09/11/2015  . Rectal bleeding 09/11/2015  . Abdominal pain 06/22/2013  . Hyponatremia 06/22/2013  . Hyperglycemia 06/22/2013  . Nausea with vomiting 06/22/2013  . Hematuria, microscopic 06/22/2013  . Diarrhea 06/22/2013    Past Surgical History:  Procedure Laterality Date  . CHOLECYSTECTOMY  06/14/15   Dr. Karlyn Agee: path: cholelithiasis with chronic cholecystitis  . COLONOSCOPY  06/04/15   Dr. Doristine Mango: normal  . COLONOSCOPY WITH PROPOFOL N/A 09/26/2015   next tcs in 5 years for tubular adenoma. random colon bx negative. subtle muscosal changes of TI with negative bx, int hemorrhoids.   . ESOPHAGOGASTRODUODENOSCOPY   06/27/15   Dr. Karlyn Agee: gastritis  . KNEE SURGERY Right    arthroscopy x4       Home Medications    Prior to Admission medications   Medication Sig Start Date End Date Taking? Authorizing Provider  dicyclomine (BENTYL) 20 MG tablet Take 1 tablet by mouth 3 (three) times daily. 09/17/15   Historical Provider, MD  HYDROcodone-acetaminophen (NORCO/VICODIN) 5-325 MG tablet Take 1 tablet by mouth every 4 (four) hours as needed for moderate pain. Patient not taking: Reported on 10/30/2015 10/11/15   Mahala Menghini, PA-C  lisinopril-hydrochlorothiazide (PRINZIDE,ZESTORETIC) 20-12.5 MG tablet Take 1 tablet by mouth daily. 09/04/15   Historical Provider, MD  ondansetron (ZOFRAN) 4 MG tablet Take 4 mg by mouth every 8 (eight) hours as needed for nausea or vomiting.    Historical Provider, MD  pantoprazole (PROTONIX) 40 MG tablet Take 40 mg by mouth daily.  06/27/15 01/22/16  Historical Provider, MD  sucralfate (CARAFATE) 1 g tablet Take 1 g by mouth 4 (four) times daily. 09/04/15   Historical Provider, MD  tamsulosin (FLOMAX) 0.4 MG CAPS capsule Take 1 capsule (0.4 mg total) by mouth daily after supper. 10/30/15   Leonard Schwartz, MD    Family History Family History  Problem Relation Age of Onset  . Irritable bowel syndrome Sister   . Colitis Maternal Aunt     had colostomy  . Irritable bowel syndrome Paternal Grandmother   . Crohn's disease  Neg Hx   . Colon cancer Neg Hx   . Celiac disease Neg Hx     Social History Social History  Substance Use Topics  . Smoking status: Never Smoker  . Smokeless tobacco: Current User  . Alcohol use 0.0 oz/week     Comment: rare     Allergies   Toradol [ketorolac tromethamine]   Review of Systems Review of Systems  Constitutional: Negative for activity change, appetite change and fever.  Eyes: Negative for visual disturbance.  Respiratory: Positive for chest tightness and shortness of breath.   Cardiovascular: Positive for chest pain.    Gastrointestinal: Positive for nausea. Negative for abdominal pain and vomiting.  Genitourinary: Negative for dysuria and frequency.  Musculoskeletal: Negative for back pain and neck pain.  Skin: Negative for rash.  Neurological: Positive for numbness ("tingling" to fingers of both hands). Negative for dizziness, syncope, weakness and headaches.  Psychiatric/Behavioral: The patient is nervous/anxious.      Physical Exam Updated Vital Signs BP 148/72   Pulse 83   Temp 97.9 F (36.6 C) (Oral)   Resp 14   Ht 6\' 4"  (1.93 m)   Wt (!) 140.6 kg   SpO2 98%   BMI 37.73 kg/m   Physical Exam  Constitutional: He is oriented to person, place, and time. He appears well-developed and well-nourished.  Anxious appearing  HENT:  Head: Normocephalic and atraumatic.  Mouth/Throat: Oropharynx is clear and moist.  Eyes: EOM are normal. Pupils are equal, round, and reactive to light.  Neck: Normal range of motion. Neck supple.  Cardiovascular: Normal rate, regular rhythm and normal heart sounds.   Pulmonary/Chest: Effort normal and breath sounds normal. No respiratory distress. He exhibits no tenderness.  Abdominal: Soft. He exhibits no distension and no mass. There is no tenderness. There is no guarding.  Musculoskeletal: Normal range of motion. He exhibits no tenderness.  Lymphadenopathy:    He has no cervical adenopathy.  Neurological: He is alert and oriented to person, place, and time. He exhibits normal muscle tone. Coordination normal.  Skin: Skin is warm and dry.  Nursing note and vitals reviewed.    ED Treatments / Results  Labs (all labs ordered are listed, but only abnormal results are displayed) Labs Reviewed  BASIC METABOLIC PANEL - Abnormal; Notable for the following:       Result Value   Potassium 3.3 (*)    Glucose, Bld 111 (*)    All other components within normal limits  CBC  TROPONIN I  DIFFERENTIAL    EKG  EKG Interpretation  Date/Time:  Sunday November 17 2015  22:23:17 EDT Ventricular Rate:  98 PR Interval:  140 QRS Duration: 96 QT Interval:  348 QTC Calculation: 444 R Axis:   79 Text Interpretation:  Normal sinus rhythm Normal ECG since last tracing no significant change Confirmed by BELFI  MD, MELANIE ([id number]) on 11/18/2015 9:00:09 AM       Radiology Dg Chest 2 View  Result Date: 11/17/2015 CLINICAL DATA:  Central chest pain, onset 1 hour prior. Arm numbness. EXAM: CHEST  2 VIEW COMPARISON:  None. FINDINGS: The cardiomediastinal contours are normal. The lungs are clear. Pulmonary vasculature is normal. No consolidation, pleural effusion, or pneumothorax. No acute osseous abnormalities are seen. IMPRESSION: No active cardiopulmonary disease. Electronically Signed   By: Jeb Levering M.D.   On: 11/17/2015 23:02   Procedures Procedures (including critical care time)  Medications Ordered in ED Medications  morphine 4 MG/ML injection 4 mg (4 mg  Intravenous Given 11/17/15 2352)  ondansetron (ZOFRAN) injection 4 mg (4 mg Intravenous Given 11/17/15 2352)  LORazepam (ATIVAN) injection 1 mg (1 mg Intravenous Given 11/18/15 0052)     Initial Impression / Assessment and Plan / ED Course  I have reviewed the triage vital signs and the nursing notes.  Pertinent labs & imaging results that were available during my care of the patient were reviewed by me and considered in my medical decision making (see chart for details).  Clinical Course   Pt had neg stress echocardiogram 3/17.  Labs, EKG and CXR are reassuring.  Pain reproducible with movement.  Doubt PE. He is feeling better after medications and appears more calm.  Appears stable for d/c and agrees to PMD f/u.  I will rx NSAID    Final Clinical Impressions(s) / ED Diagnoses   Final diagnoses:  Nonspecific chest pain    New Prescriptions New Prescriptions   No medications on file     Kem Parkinson, PA-C 11/19/15 [place], MD 11/23/15 [phone]

## 2015-11-18 MED ORDER — IBUPROFEN 600 MG PO TABS: 600.0000 mg | ORAL_TABLET | Freq: Three times a day (TID) | ORAL | 0 refills | Status: DC

## 2015-11-18 MED ORDER — LORAZEPAM 2 MG/ML IJ SOLN
1.0000 mg | Freq: Once | INTRAMUSCULAR | Status: AC
  Administered 2015-11-18: 1 mg via INTRAVENOUS
  Filled 2015-11-18: qty 1

## 2015-11-18 MED ORDER — HYDROCODONE-ACETAMINOPHEN 5-325 MG PO TABS: ORAL_TABLET | ORAL | 0 refills | Status: DC

## 2015-11-18 NOTE — ED Provider Notes (Signed)
ED ECG REPORT   Date: 11/18/2015  Rate: 98  Rhythm: normal sinus rhythm  QRS Axis: normal  Intervals: normal  ST/T Wave abnormalities: normal  Conduction Disutrbances:none  Narrative Interpretation:   Old EKG Reviewed: none available  I have personally reviewed the EKG tracing and agree  with the computerized printout as noted.   Rolland Porter, MD, Barbette Or, MD 11/18/15 [phone]

## 2015-11-18 NOTE — ED Notes (Signed)
Patient complaining of pain in the middle of his chest, going into his back at this time.  Kem Parkinson, PA notified.

## 2015-11-18 NOTE — Discharge Instructions (Signed)
Follow-up with your primary doctor this week.  Return to ER for any worsening symptoms

## 2015-12-13 NOTE — Progress Notes (Signed)
Labs are okay except glucose high, even if was not fasting.  #1 I don't see results for the heavy metal screen, Almyra Free please check on that. #2 Recommend follow up with PCP to rule out diabetes.  #3 start referral to [facility] GI, abdominal pain. Make sure all the records go.

## 2015-12-16 ENCOUNTER — Other Ambulatory Visit: Payer: Self-pay

## 2015-12-16 DIAGNOSIS — R109 Unspecified abdominal pain: Secondary | ICD-10-CM

## 2015-12-26 ENCOUNTER — Other Ambulatory Visit: Payer: Self-pay

## 2015-12-26 DIAGNOSIS — R112 Nausea with vomiting, unspecified: Secondary | ICD-10-CM

## 2015-12-26 DIAGNOSIS — R1013 Epigastric pain: Secondary | ICD-10-CM

## 2015-12-26 DIAGNOSIS — R197 Diarrhea, unspecified: Secondary | ICD-10-CM

## 2015-12-26 DIAGNOSIS — R1031 Right lower quadrant pain: Secondary | ICD-10-CM

## 2015-12-26 DIAGNOSIS — K625 Hemorrhage of anus and rectum: Secondary | ICD-10-CM

## 2015-12-26 DIAGNOSIS — R634 Abnormal weight loss: Secondary | ICD-10-CM

## 2015-12-26 NOTE — Progress Notes (Signed)
Patient needs to have heavy metal testing done.

## 2016-10-28 LAB — AMINOLEVULINIC ACID, 24 HOUR

## 2016-10-28 LAB — MERCURY, URINE, 24 HOUR

## 2016-10-28 LAB — LEAD, URINE, 24 HOUR

## 2016-10-28 LAB — CADMIUM, URINE, 24 HOUR

## 2017-04-20 HISTORY — PX: OTHER SURGICAL HISTORY: SHX169

## 2017-04-20 HISTORY — PX: LYMPH NODE DISSECTION: SHX5087

## 2017-10-12 ENCOUNTER — Encounter: Payer: Self-pay | Admitting: *Deleted

## 2017-10-13 ENCOUNTER — Encounter: Payer: Self-pay | Admitting: Neurology

## 2017-10-13 ENCOUNTER — Ambulatory Visit (INDEPENDENT_AMBULATORY_CARE_PROVIDER_SITE_OTHER): Payer: PRIVATE HEALTH INSURANCE | Admitting: Neurology

## 2017-10-13 VITALS — BP 137/86 | HR 86 | Ht 76.0 in | Wt 353.0 lb

## 2017-10-13 DIAGNOSIS — E785 Hyperlipidemia, unspecified: Secondary | ICD-10-CM

## 2017-10-13 DIAGNOSIS — R7309 Other abnormal glucose: Secondary | ICD-10-CM | POA: Diagnosis not present

## 2017-10-13 DIAGNOSIS — G43109 Migraine with aura, not intractable, without status migrainosus: Secondary | ICD-10-CM | POA: Diagnosis not present

## 2017-10-13 DIAGNOSIS — G43409 Hemiplegic migraine, not intractable, without status migrainosus: Secondary | ICD-10-CM

## 2017-10-13 MED ORDER — PROPRANOLOL HCL ER 120 MG PO CP24: 120.0000 mg | ORAL_CAPSULE | Freq: Every day | ORAL | 6 refills | Status: DC

## 2017-10-13 NOTE — Patient Instructions (Signed)
Propranolol extended-release capsules What is this medicine? PROPRANOLOL (proe PRAN oh lole) is a beta-blocker. Beta-blockers reduce the workload on the heart and help it to beat more regularly. This medicine is used to treat high blood pressure, heart muscle disease, and prevent chest pain caused by angina. It is also used to prevent migraine headaches. You should not use this medicine to treat a migraine that has already started. This medicine may be used for other purposes; ask your health care provider or pharmacist if you have questions. COMMON BRAND NAME(S): Inderal LA, Inderal XL, InnoPran XL What should I tell my health care provider before I take this medicine? They need to know if you have any of these conditions: -circulation problems, or blood vessel disease -diabetes -history of heart attack or heart disease, vasospastic angina -kidney disease -liver disease -lung or breathing disease, like asthma or emphysema -pheochromocytoma -slow heart rate -thyroid disease -an unusual or allergic reaction to propranolol, other beta-blockers, medicines, foods, dyes, or preservatives -pregnant or trying to get pregnant -breast-feeding How should I use this medicine? Take this medicine by mouth with a glass of water. Follow the directions on the prescription label. Do not crush or chew. Take your doses at regular intervals. Do not take your medicine more often than directed. Do not stop taking except on the advice of your doctor or health care professional. Talk to your pediatrician regarding the use of this medicine in children. Special care may be needed. Overdosage: If you think you have taken too much of this medicine contact a poison control center or emergency room at once. NOTE: This medicine is only for you. Do not share this medicine with others. What if I miss a dose? If you miss a dose, take it as soon as you can. If it is almost time for your next dose, take only that dose. Do not  take double or extra doses. What may interact with this medicine? Do not take this medicine with any of the following medications: -feverfew -phenothiazines like chlorpromazine, mesoridazine, prochlorperazine, thioridazine This medicine may also interact with the following medications: -aluminum hydroxide gel -antipyrine -antiviral medicines for HIV or AIDS -barbiturates like phenobarbital -certain medicines for blood pressure, heart disease, irregular heart beat -cimetidine -ciprofloxacin -diazepam -fluconazole -haloperidol -isoniazid -medicines for cholesterol like cholestyramine or colestipol -medicines for mental depression -medicines for migraine headache like almotriptan, eletriptan, frovatriptan, naratriptan, rizatriptan, sumatriptan, zolmitriptan -NSAIDs, medicines for pain and inflammation, like ibuprofen or naproxen -phenytoin -rifampin -teniposide -theophylline -thyroid medicines -tolbutamide -warfarin -zileuton This list may not describe all possible interactions. Give your health care provider a list of all the medicines, herbs, non-prescription drugs, or dietary supplements you use. Also tell them if you smoke, drink alcohol, or use illegal drugs. Some items may interact with your medicine. What should I watch for while using this medicine? Visit your doctor or health care professional for regular check ups. Contact your doctor right away if your symptoms worsen. Check your blood pressure and pulse rate regularly. Ask your health care professional what your blood pressure and pulse rate should be, and when you should contact them. Do not stop taking this medicine suddenly. This could lead to serious heart-related effects. You may get drowsy or dizzy. Do not drive, use machinery, or do anything that needs mental alertness until you know how this drug affects you. Do not stand or sit up quickly, especially if you are an older patient. This reduces the risk of dizzy or  fainting spells. Alcohol can  make you more drowsy and dizzy. Avoid alcoholic drinks. This medicine can affect blood sugar levels. If you have diabetes, check with your doctor or health care professional before you change your diet or the dose of your diabetic medicine. Do not treat yourself for coughs, colds, or pain while you are taking this medicine without asking your doctor or health care professional for advice. Some ingredients may increase your blood pressure. What side effects may I notice from receiving this medicine? Side effects that you should report to your doctor or health care professional as soon as possible: -allergic reactions like skin rash, itching or hives, swelling of the face, lips, or tongue -breathing problems -changes in blood sugar -cold hands or feet -difficulty sleeping, nightmares -dry peeling skin -hallucinations -muscle cramps or weakness -slow heart rate -swelling of the legs and ankles -vomiting Side effects that usually do not require medical attention (report to your doctor or health care professional if they continue or are bothersome): -change in sex drive or performance -diarrhea -dry sore eyes -hair loss -nausea -weak or tired This list may not describe all possible side effects. Call your doctor for medical advice about side effects. You may report side effects to FDA at 1-800-FDA-1088. Where should I keep my medicine? Keep out of the reach of children. Store at room temperature between 15 and 30 degrees C (59 and 86 degrees F). Protect from light, moisture and freezing. Keep container tightly closed. Throw away any unused medicine after the expiration date. NOTE: This sheet is a summary. It may not cover all possible information. If you have questions about this medicine, talk to your doctor, pharmacist, or health care provider.  2018 Elsevier/Gold Standard (2012-12-09 14:58:56)

## 2017-10-13 NOTE — Progress Notes (Signed)
[facility]    Provider:  Dr Jaynee Eagles Referring Provider: Caryl Bis, MD Primary Care Physician:  Caryl Bis, MD  CC:  Headaches  HPI:  Matthew Vance is a 37 y.o. male here as a referral from Dr. Quillian Quince for headaches.  Patient endorses depression and anxiety and racing thoughts.  Past medical history of renal disorder, hypertension, GERD, nausea, diarrhea, hyperglycemia, hyponatremia, abnormal weight loss, abdominal pain.  He is on lithium, Risperdal, Zoloft.  Headaches in the setting of stress. Patient reports he has been having headaches, started in April under stress otherwise no new meds, no illnesses, no head trauma or any other inciting event. Headache started left side of the head, acute, pressure, pulsating, he had light and sound sensitivity, nausea, movement made it worse, he had vision changes and weakness of the left side. It lasted most of the day and into the next day and several days before all the symptoms resolved.No Fhx of headaches. He has headaches 8 headache days a month, similar but not as severe he has to lay down. He reports peripheral vision loss with the headache that resolves and does not complain, he wakes up with headaches, he wakes up with dry mouth, he was diagnosed with sleep apnea in the last year.No other focal neurologic deficits, associated symptoms, inciting events or modifiable factors.    Reviewed notes, labs and imaging from outside physicians, which showed:  Patient presented with a supposed to TIA on 08/08/2017 with worsening symptoms 08/09/2017.  Symptom was sudden in onset and started several days prior.  He was brought in by his mother complaining of slurred speech that started 15 minutes prior to arrival, he was sitting on the couch when he grabbed his head and told his mom that his head hurt, they immediately proceeded to the emergency room, in the setting of lots of stress in his life.  Patient complained of dizziness,  numbness, headache, impaired speech and weakness.  Diagnosed with migraine with hemiplegia.  His BMI is 19 or less.  A code stroke was called 15 minutes of slurred speech.  CT of the head was negative aspects score 10 (reviewed radiology report).  CT angios of the head and neck was negative no emergent large vessel occlusion (reviewed CT radiology report).  There is a family history of stroke.  Parents father, grandfather and grandmother have all had strokes previously.  Neuro exam was significant for left-sided pronator drift, left partial facial paralysis, patchy left hemianopia, 4 out of 5 left-sided strength, slurred speech, normal gait.  NIH stroke scale was 11.  Patient apparently has been seen multiple times in the emergency room in 2019 with chest pain, parotitis, and 2018 he was seen 7 times by physicians and has been seen twice in 2019 already, patient was seen 27 times in the emergency room by physicians in 2017.  CMP showed elevated glucose at 130 BUN 7 creatinine 0.69 labs are drawn August 08, 2017.  Liver function tests were normal.  CBC showed some mildly elevated white cells 11.9 otherwise unremarkable,  MRI of the brain 2019 reviewed report from [facility] was normal.  Patient has been evaluated by cardiology in the past in 2017 and had multiple scans including stress echo with Dopplers and EKGs.  Review of Systems: Patient complains of symptoms per HPI as well as the following symptoms: Weight gain, fatigue, blurred vision, confusion, headache, weakness, dizziness, insomnia, restless leg, depression, anxiety, not enough sleep, decreased energy, change in appetite, disinterest in  activities, racing thoughts. Pertinent negatives and positives per HPI. All others negative.   Social History   Socioeconomic History  . Marital status: Single    Spouse name: Not on file  . Number of children: 2  . Years of education: some college  . Highest education level: Not on file  Occupational History   . Occupation: [employer] [place]  . Financial resource strain: Not on file  . Food insecurity:    Worry: Not on file    Inability: Not on file  . Transportation needs:    Medical: Not on file    Non-medical: Not on file  Tobacco Use  . Smoking status: Never Smoker  . Smokeless tobacco: Current User  Substance and Sexual Activity  . Alcohol use: Yes    Alcohol/week: 0.0 oz    Comment: rare  . Drug use: No  . Sexual activity: Yes    Birth control/protection: None  Lifestyle  . Physical activity:    Days per week: Not on file    Minutes per session: Not on file  . Stress: Not on file  Relationships  . Social connections:    Talks on phone: Not on file    Gets together: Not on file    Attends religious service: Not on file    Active member of club or organization: Not on file    Attends meetings of clubs or organizations: Not on file    Relationship status: Not on file  . Intimate partner violence:    Fear of current or ex partner: Not on file    Emotionally abused: Not on file    Physically abused: Not on file    Forced sexual activity: Not on file  Other Topics Concern  . Not on file  Social History Narrative   Lives with children   Caffeine- 1 20 oz daily    Family History  Problem Relation Age of Onset  . Irritable bowel syndrome Sister   . Colitis Maternal Aunt        had colostomy  . Irritable bowel syndrome Paternal Grandmother   . Crohn's disease Neg Hx   . Colon cancer Neg Hx   . Celiac disease Neg Hx   . Migraines Neg Hx     Past Medical History:  Diagnosis Date  . Anxiety   . GERD (gastroesophageal reflux disease)   . Hypertension   . Migraine   . Renal disorder    kidney stone    Past Surgical History:  Procedure Laterality Date  . CHOLECYSTECTOMY  06/14/15   Dr. Karlyn Agee: path: cholelithiasis with chronic cholecystitis  . COLONOSCOPY  06/04/15   Dr. Doristine Mango: normal  . COLONOSCOPY WITH PROPOFOL N/A 09/26/2015    next tcs in 5 years for tubular adenoma. random colon bx negative. subtle muscosal changes of TI with negative bx, int hemorrhoids.   . ESOPHAGOGASTRODUODENOSCOPY  06/27/15   Dr. Karlyn Agee: gastritis  . HAND SURGERY Right    trigger finger  . HERNIA REPAIR     umbilical hernia  . KNEE SURGERY Right    arthroscopy x4  . 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 CARTIA XT 120 MG 24 hr capsule Take 120 mg by mouth daily.  3  . CARTIA XT 240 MG 24 hr capsule Take 240 mg by mouth daily.  1  .  divalproex (DEPAKOTE ER) 500 MG 24 hr tablet TAKE TWO TABLETS BY MOUTH AT BEDTIME FOR bipolar disorder  2  . hydrochlorothiazide (HYDRODIURIL) 12.5 MG tablet Take 12.5 mg by mouth daily.  1  . risperiDONE (RISPERDAL) 2 MG tablet Take 2 mg by mouth 2 (two) times daily.    . sertraline (ZOLOFT) 100 MG tablet Take 200 mg by mouth daily.    . propranolol ER (INDERAL LA) 120 MG 24 hr capsule Take 1 capsule (120 mg total) by mouth daily. 30 capsule 6   No current facility-administered medications for this visit.     Allergies as of 10/13/2017 - Review Complete 10/13/2017  Allergen Reaction Noted  . Toradol [ketorolac tromethamine]  10/30/2015    Vitals: BP 137/86   Pulse 86   Ht 6' 4"  (1.93 m)   Wt (!) 353 lb (160.1 kg)   BMI 42.97 kg/m  Last Weight:  Wt Readings from Last 1 Encounters:  10/13/17 (!) 353 lb (160.1 kg)   Last Height:   Ht Readings from Last 1 Encounters:  10/13/17 6' 4"  (1.93 m)   Physical exam: Exam: Gen: NAD, conversant, well nourised, obese, well groomed                     CV: RRR, no MRG. No Carotid Bruits. No peripheral edema, warm, nontender Eyes: Conjunctivae clear without exudates or hemorrhage  Neuro: Detailed Neurologic Exam  Speech:    Speech is normal; fluent and spontaneous with normal comprehension.  Cognition:    The patient is oriented to person,  place, and time;     recent and remote memory intact;     language fluent;     normal attention, concentration,     fund of knowledge Cranial Nerves:    The pupils are equal, round, and reactive to light. The fundi are normal and spontaneous venous pulsations are present. Visual fields are full to finger confrontation. Extraocular movements are intact. Trigeminal sensation is intact and the muscles of mastication are normal. The face is symmetric. The palate elevates in the midline. Hearing intact. Voice is normal. Shoulder shrug is normal. The tongue has normal motion without fasciculations.   Coordination:    Normal finger to nose and heel to shin. Normal rapid alternating movements.   Gait:    Heel-toe and tandem gait are normal.   Motor Observation:    No asymmetry, no atrophy, and no involuntary movements noted. Tone:    Normal muscle tone.    Posture:    Posture is normal. normal erect    Strength:    Strength is V/V in the upper and lower limbs.      Sensation: intact to LT     Reflex Exam:  DTR's:    Deep tendon reflexes in the upper and lower extremities are normal bilaterally.   Toes:    The toes are downgoing bilaterally.   Clonus:    Clonus is absent.       Assessment/Plan: 37 year old patient with left-sided headache and hemiparesis in the setting of "tremendous stress". MRI  of the brain normal and entrie stroke workup was normal.  Discussed untreated sleep apnea sequelae including increased risk of stroke, he has not used the cpap, encouraged him to see his sleep doctor and pursue treatment  No driving until he treats his sleep apnea.   Weight loss: discussed weight loss center, diet, exercise, stress  Continue Depakote, sertraline and Diltiazem which all have efficacy in migraine. Start  propranolol for migraines which may also help with HTN and anxiety.  Acute management: triptans contraindicated, try 851m of ibuprofen right at onset. Do not use more  than 2x a week to avoid medication overuse or rebound headaches.   Highly unlikely TIAs but will check ldl and hgba1c, follow closely with pcp for management of risk factors like obesity and sleep apnea  F/u 4 months in 4-6 weeks email me for update we may need to further increase propranolol, watch BP and pulse, stop for any significant side effects such as SOB, CP see AVS reviewed with patient  Orders Placed This Encounter  Procedures  . Hemoglobin A1c  . Lipid panel     Discussed: To prevent or relieve headaches, try the following: Cool Compress. Lie down and place a cool compress on your head.  Avoid headache triggers. If certain foods or odors seem to have triggered your migraines in the past, avoid them. A headache diary might help you identify triggers.  Include physical activity in your daily routine. Try a daily walk or other moderate aerobic exercise.  Manage stress. Find healthy ways to cope with the stressors, such as delegating tasks on your to-do list.  Practice relaxation techniques. Try deep breathing, yoga, massage and visualization.  Eat regularly. Eating regularly scheduled meals and maintaining a healthy diet might help prevent headaches. Also, drink plenty of fluids.  Follow a regular sleep schedule. Sleep deprivation might contribute to headaches Consider biofeedback. With this mind-body technique, you learn to control certain bodily functions - such as muscle tension, heart rate and blood pressure - to prevent headaches or reduce headache pain.    Proceed to emergency room if you experience new or worsening symptoms or symptoms do not resolve, if you have new neurologic symptoms or if headache is severe, or for any concerning symptom.   Provided education and documentation from American headache Society toolbox including articles on: chronic migraine medication overuse headache, chronic migraines, prevention of migraines, behavioral and other nonpharmacologic  treatments for headache.  Cc: Dr. DJacklynn Bue MD  [facility] [address] StreetSLatimerGOroville [place] [address] Phone [phone] [phone]

## 2017-10-13 NOTE — Addendum Note (Signed)
Addended by: Inis Sizer D on: 10/13/2017 08:20 AM   Modules accepted: Orders

## 2017-10-14 ENCOUNTER — Telehealth: Payer: Self-pay | Admitting: *Deleted

## 2017-10-14 LAB — LIPID PANEL
Chol/HDL Ratio: 6.1 ratio — ABNORMAL HIGH (ref 0.0–5.0)
Cholesterol, Total: 201 mg/dL — ABNORMAL HIGH (ref 100–199)
HDL: 33 mg/dL — AB (ref >39)
TRIGLYCERIDES: 414 mg/dL — AB (ref 0–149)

## 2017-10-14 LAB — HEMOGLOBIN A1C
ESTIMATED AVERAGE GLUCOSE: 143 mg/dL
HEMOGLOBIN A1C: 6.6 % — AB (ref 4.8–5.6)

## 2017-10-14 NOTE — Telephone Encounter (Signed)
Spoke with patient and discussed lab results including HgbA1C being at diabetes level and extremely elevated cholesterol, elevated trig, low HDL. Discussed the potential serious complications these abnormal labs can cause that were included in result note. Recommended pt f/u with PCP in the next 3 months or as soon as they want to see him. He verbalized appreciation and understanding. He is also aware that results were sent to PCP.

## 2017-10-14 NOTE — Telephone Encounter (Signed)
-----   Message from Melvenia Beam, MD sent at 10/14/2017 11:11 AM EDT ----- Patient is diabetic. In addition is cholesterol is extremely elevated. He needs to follow up with his primary care for management of these conditions as they are risk factors for stroke, heart disease, obesity and other serious sequelae. Please forward labs to his pcp and ask him to follow up in the next 3 months with primary care for this thanks.

## 2017-10-14 NOTE — Telephone Encounter (Signed)
Called pt & LVM asking for call back.  

## 2017-10-26 ENCOUNTER — Emergency Department (HOSPITAL_COMMUNITY)
Admission: EM | Admit: 2017-10-26 | Discharge: 2017-10-26 | Disposition: A | Discharge status: Home / Self Care | Payer: PRIVATE HEALTH INSURANCE | Attending: Emergency Medicine | Admitting: Emergency Medicine

## 2017-10-26 ENCOUNTER — Encounter (HOSPITAL_COMMUNITY): Payer: Self-pay | Admitting: Emergency Medicine

## 2017-10-26 DIAGNOSIS — N201 Calculus of ureter: Secondary | ICD-10-CM | POA: Diagnosis not present

## 2017-10-26 DIAGNOSIS — Z79899 Other long term (current) drug therapy: Secondary | ICD-10-CM | POA: Diagnosis not present

## 2017-10-26 DIAGNOSIS — I10 Essential (primary) hypertension: Secondary | ICD-10-CM | POA: Insufficient documentation

## 2017-10-26 DIAGNOSIS — N23 Unspecified renal colic: Secondary | ICD-10-CM

## 2017-10-26 DIAGNOSIS — R109 Unspecified abdominal pain: Secondary | ICD-10-CM | POA: Diagnosis present

## 2017-10-26 LAB — CBC WITH DIFFERENTIAL/PLATELET
BASOS PCT: 0 %
Basophils Absolute: 0 10*3/uL (ref 0.0–0.1)
EOS ABS: 0.2 10*3/uL (ref 0.0–0.7)
EOS PCT: 2 %
HCT: 42.5 % (ref 39.0–52.0)
Hemoglobin: 15.1 g/dL (ref 13.0–17.0)
LYMPHS ABS: 1.6 10*3/uL (ref 0.7–4.0)
LYMPHS PCT: 18 %
MCH: 31.9 pg (ref 26.0–34.0)
MCHC: 35.5 g/dL (ref 30.0–36.0)
MCV: 89.7 fL (ref 78.0–100.0)
MONOS PCT: 8 %
Monocytes Absolute: 0.7 10*3/uL (ref 0.1–1.0)
Neutro Abs: 6.2 10*3/uL (ref 1.7–7.7)
Neutrophils Relative %: 72 %
PLATELETS: 154 10*3/uL (ref 150–400)
RBC: 4.74 MIL/uL (ref 4.22–5.81)
RDW: 12.7 % (ref 11.5–15.5)
WBC: 8.6 10*3/uL (ref 4.0–10.5)

## 2017-10-26 LAB — BASIC METABOLIC PANEL
Anion gap: 10 (ref 5–15)
BUN: 16 mg/dL (ref 6–20)
CALCIUM: 8.8 mg/dL — AB (ref 8.9–10.3)
CO2: 30 mmol/L (ref 22–32)
CREATININE: 0.96 mg/dL (ref 0.61–1.24)
Chloride: 93 mmol/L — ABNORMAL LOW (ref 98–111)
Glucose, Bld: 125 mg/dL — ABNORMAL HIGH (ref 70–99)
Potassium: 4 mmol/L (ref 3.5–5.1)
SODIUM: 133 mmol/L — AB (ref 135–145)

## 2017-10-26 LAB — URINALYSIS, ROUTINE W REFLEX MICROSCOPIC
Bilirubin Urine: NEGATIVE
GLUCOSE, UA: NEGATIVE mg/dL
HGB URINE DIPSTICK: NEGATIVE
KETONES UR: NEGATIVE mg/dL
Leukocytes, UA: NEGATIVE
Nitrite: NEGATIVE
PROTEIN: NEGATIVE mg/dL
Specific Gravity, Urine: 1.023 (ref 1.005–1.030)
pH: 5 (ref 5.0–8.0)

## 2017-10-26 MED ORDER — SODIUM CHLORIDE 0.9 % IV BOLUS
500.0000 mL | Freq: Once | INTRAVENOUS | Status: AC
  Administered 2017-10-26: 500 mL via INTRAVENOUS

## 2017-10-26 MED ORDER — SODIUM CHLORIDE 0.9 % IV SOLN
INTRAVENOUS | Status: DC
  Administered 2017-10-26: 17:00:00 via INTRAVENOUS

## 2017-10-26 MED ORDER — HYDROMORPHONE HCL 1 MG/ML IJ SOLN
1.0000 mg | INTRAMUSCULAR | Status: AC | PRN
Start: 2017-10-26 — End: 2017-10-26
  Administered 2017-10-26 (×2): 1 mg via INTRAVENOUS
  Filled 2017-10-26 (×2): qty 1

## 2017-10-26 MED ORDER — ONDANSETRON HCL 4 MG/2ML IJ SOLN
4.0000 mg | Freq: Once | INTRAMUSCULAR | Status: AC
  Administered 2017-10-26: 4 mg via INTRAVENOUS
  Filled 2017-10-26: qty 2

## 2017-10-26 NOTE — ED Provider Notes (Signed)
[facility] EMERGENCY DEPARTMENT Provider Note   CSN: [id number] Arrival date & time: 10/26/17  1147     History   Chief Complaint Chief Complaint  Patient presents with  . Flank Pain    HPI Matthew Vance is a 37 y.o. male.  HPI Patient presents to the emergency room for evaluation of persistent right flank pain.  Patient states the symptoms started 3 days ago.  He went to [facility] the other day.  He was evaluated and diagnosed with ureteral stones.  Patient states he was prescribed Keflex and Percocet however his pain persists.  Today it was very severe so he had to come back to the emergency room.  Patient denies any trouble with any fevers.  No vomiting or diarrhea.  Past Medical History:  Diagnosis Date  . Anxiety   . GERD (gastroesophageal reflux disease)   . Hypertension   . Migraine   . Renal disorder    kidney stone    Patient Active Problem List   Diagnosis Date Noted  . Migraine with aura and without status migrainosus 10/13/2017  . 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 RLQ abdominal pain 10/11/2015  . History of colonic polyps   . Abnormal computed tomography of cecum and terminal ileum   . Abnormal weight loss 09/11/2015  . Rectal bleeding 09/11/2015  . Abdominal pain 06/22/2013  . Hyponatremia 06/22/2013  . Hyperglycemia 06/22/2013  . Nausea with vomiting 06/22/2013  . Hematuria, microscopic 06/22/2013  . Diarrhea 06/22/2013    Past Surgical History:  Procedure Laterality Date  . CHOLECYSTECTOMY  06/14/15   Dr. Karlyn Agee: path: cholelithiasis with chronic cholecystitis  . COLONOSCOPY  06/04/15   Dr. Doristine Mango: normal  . COLONOSCOPY WITH PROPOFOL N/A 09/26/2015   next t[facility] in 5 years for tubular adenoma. random colon bx negative. subtle muscosal changes of TI with negative bx, int hemorrhoids.   . ESOPHAGOGASTRODUODENOSCOPY  06/27/15   Dr. Karlyn Agee: gastritis  . HAND SURGERY Right    trigger finger  . HERNIA REPAIR     umbilical hernia  . KNEE SURGERY Right    arthroscopy x4  . LYMPH NODE DISSECTION     2 removed in left neck, "bonded together"        Home Medications    Prior to Admission medications   Medication Sig Start Date End Date Taking? Authorizing Provider  CARTIA XT 120 MG 24 hr capsule Take 120 mg by mouth daily. 08/24/17   [provider]  CARTIA XT 240 MG 24 hr capsule Take 240 mg by mouth daily. 09/06/17   [provider]  divalproex (DEPAKOTE ER) 500 MG 24 hr tablet TAKE TWO TABLETS BY MOUTH AT BEDTIME FOR bipolar disorder 09/26/17   [provider]  hydrochlorothiazide (HYDRODIURIL) 12.5 MG tablet Take 12.5 mg by mouth daily. 09/14/17   [provider]  propranolol ER (INDERAL LA) 120 MG 24 hr capsule Take 1 capsule (120 mg total) by mouth daily. 10/13/17   Melvenia Beam, MD  risperiDONE (RISPERDAL) 2 MG tablet Take 2 mg by mouth 2 (two) times daily.    [provider]  sertraline (ZOLOFT) 100 MG tablet Take 200 mg by mouth daily.    [provider]    Family History Family History  Problem Relation Age of Onset  . Irritable bowel syndrome Sister   . Colitis Maternal Aunt        had colostomy  . Irritable bowel syndrome Paternal Grandmother   .  Crohn's disease Neg Hx   . Colon cancer Neg Hx   . Celiac disease Neg Hx   . Migraines Neg Hx     Social History Social History   Tobacco Use  . Smoking status: Never Smoker  . Smokeless tobacco: Current User  Substance Use Topics  . Alcohol use: Yes    Alcohol/week: 0.0 oz    Comment: rare  . Drug use: No     Allergies   Toradol [ketorolac tromethamine]   Review of Systems Review of Systems  All other systems reviewed and are negative.    Physical Exam Updated Vital Signs BP 114/66   Pulse 70   Temp 97.8 F (36.6 C) (Oral)   Resp 18   Ht 1.93 m (6' 4" )   Wt (!) 160.1 kg (353 lb)   SpO2 (!) 89%   BMI 42.97 kg/m   Physical Exam  HENT:  Head:  Normocephalic and atraumatic.  Right Ear: External ear normal.  Left Ear: External ear normal.  Eyes: Conjunctivae are normal. Right eye exhibits no discharge. Left eye exhibits no discharge. No scleral icterus.  Neck: Neck supple. No tracheal deviation present.  Cardiovascular: Normal rate, regular rhythm and intact distal pulses.  Pulmonary/Chest: Effort normal and breath sounds normal. No stridor. No respiratory distress. He has no wheezes. He has no rales.  Abdominal: Soft. Bowel sounds are normal. He exhibits no distension. There is no tenderness. There is no rebound and no guarding.  CVAT right   Musculoskeletal: He exhibits no edema or tenderness.  Neurological: He is alert. He has normal strength. No cranial nerve deficit (no facial droop, extraocular movements intact, no slurred speech) or sensory deficit. He exhibits normal muscle tone. He displays no seizure activity. Coordination normal.  Skin: Skin is warm and dry. No rash noted. He is not diaphoretic.  Psychiatric: He has a normal mood and affect.  Nursing note and vitals reviewed.    ED Treatments / Results  Labs (all labs ordered are listed, but only abnormal results are displayed) Labs Reviewed  BASIC METABOLIC PANEL - Abnormal; Notable for the following components:      Result Value   Sodium 133 (*)    Chloride 93 (*)    Glucose, Bld 125 (*)    Calcium 8.8 (*)    All other components within normal limits  URINALYSIS, ROUTINE W REFLEX MICROSCOPIC  CBC WITH DIFFERENTIAL/PLATELET    EKG None  Radiology No results found.  Procedures Procedures (including critical care time)  Medications Ordered in ED Medications  sodium chloride 0.9 % bolus 500 mL (0 mLs Intravenous Stopped 10/26/17 1724)    And  0.9 %  sodium chloride infusion ( Intravenous New Bag/Given 10/26/17 1724)  HYDROmorphone (DILAUDID) injection 1 mg (1 mg Intravenous Given 10/26/17 1656)  ondansetron (ZOFRAN) injection 4 mg (4 mg Intravenous Given  10/26/17 1656)     Initial Impression / Assessment and Plan / ED Course  I have reviewed the triage vital signs and the nursing notes.  Pertinent labs & imaging results that were available during my care of the patient were reviewed by me and considered in my medical decision making (see chart for details).  Clinical Course as of Oct 26 1741  Tue Oct 26, 2017  1609 Outside film report:  IMPRESSION: 1. Obstructive right reread or lithiasis measuring up to 5 mm with secondary moderate right hydronephrosis. 2. Additional bilateral nonobstructive nephrolithiasis as above. 3. Two separate intermediate density left renal lesions, indeterminate.  Further evaluation with renal mass protocol MRI recommended for further evaluation if not already performed. 4. Sequelae of prior ventral hernia repair with residual and/or recurrent complex fat containing paraumbilical hernia, similar to previous. 5. Splenomegaly. 6. Hepatic steatosis.   Electronically Signed By: Jeannine Boga M.D. On: 10/24/2017 01:04        [JK]  1739 Pt is feeling better now after treatment.    [JK]    Clinical Course User Index [JK] Dorie Rank, MD    Patient presented to the emergency room for evaluation of persistent pain associated with known ureteral stones.  Patient had a CT scan 2 days ago at another hospital which confirmed ureteral stones associate with hydronephrosis.  Patient is currently on Flomax as well as opiate pain medications.  He responded to treatment in the emergency room.  Laboratory tests are reassuring.  No signs of infection or any severe complications associated with his kidney stones.  Recommended outpatient follow-up with urology.    Final Clinical Impressions(s) / ED Diagnoses   Final diagnoses:  Ureteral stone  Ureteral colic    ED Discharge Orders    None       Dorie Rank, MD 10/26/17 1744

## 2017-10-26 NOTE — Discharge Instructions (Addendum)
Make an appointment with a urologist for further evaluation, continue your current pain medications

## 2017-10-26 NOTE — ED Triage Notes (Signed)
Patient complaining of right flank pain x 3 days. States he was treated at [facility] for same and was told he had two kidney stones.

## 2017-10-27 ENCOUNTER — Ambulatory Visit: Payer: PRIVATE HEALTH INSURANCE | Admitting: Urology

## 2017-10-27 DIAGNOSIS — N201 Calculus of ureter: Secondary | ICD-10-CM

## 2017-10-28 ENCOUNTER — Other Ambulatory Visit: Payer: Self-pay | Admitting: Urology

## 2017-11-01 NOTE — Patient Instructions (Signed)
Matthew Vance  11/01/2017     @PREFPERIOPPHARMACY @   Your procedure is scheduled on  11/03/2017 .  Report to [name] at  1215  P.M.  Call this number if you have problems the morning of surgery:  [phone]   Remember:  Do not eat or drink after midnight.  You may drink clear liquids until 12 midnight 11/02/2017 .  Clear liquids allowed are:                    Water, Juice (non-citric and without pulp), Carbonated beverages, Clear Tea, Black Coffee only, Plain Jell-O only, Gatorade and Plain Popsicles only    Take these medicines the morning of surgery with A SIP OF WATER  Hygronton, diltiazem, antivert, prilosec, zofran, oxycodone, inderal, risperdal, zoloft, flomax.    Do not wear jewelry, make-up or nail polish.  Do not wear lotions, powders, or perfumes, or deodorant.  Do not shave 48 hours prior to surgery.  Men may shave face and neck.  Do not bring valuables to the hospital.  [facility] is not responsible for any belongings or valuables.  Contacts, dentures or bridgework may not be worn into surgery.  Leave your suitcase in the car.  After surgery it may be brought to your room.  For patients admitted to the hospital, discharge time will be determined by your treatment team.  Patients discharged the day of surgery will not be allowed to drive home.   Name and phone number of your driver:   family Special instructions:  None  Please read over the following fact sheets that you were given. Anesthesia Post-op Instructions and Care and Recovery After Surgery       Cystoscopy Cystoscopy is a procedure that is used to help diagnose and sometimes treat conditions that affect that lower urinary tract. The lower urinary tract includes the bladder and the tube that drains urine from the bladder out of the body (urethra). Cystoscopy is performed with a thin, tube-shaped instrument with a light and camera at the end (cystoscope). The cystoscope may be  hard (rigid) or flexible, depending on the goal of the procedure.The cystoscope is inserted through the urethra, into the bladder. Cystoscopy may be recommended if you have:  Urinary tractinfections that keep coming back (recurring).  Blood in the urine (hematuria).  Loss of bladder control (urinary incontinence) or an overactive bladder.  Unusual cells found in a urine sample.  A blockage in the urethra.  Painful urination.  An abnormality in the bladder found during an intravenous pyelogram (IVP) or CT scan.  Cystoscopy may also be done to remove a sample of tissue to be examined under a microscope (biopsy). Tell a health care provider about:  Any allergies you have.  All medicines you are taking, including vitamins, herbs, eye drops, creams, and over-the-counter medicines.  Any problems you or family members have had with anesthetic medicines.  Any blood disorders you have.  Any surgeries you have had.  Any medical conditions you have.  Whether you are pregnant or may be pregnant. What are the risks? Generally, this is a safe procedure. However, problems may occur, including:  Infection.  Bleeding.  Allergic reactions to medicines.  Damage to other structures or organs.  What happens before the procedure?  Ask your health care provider about: ? Changing or stopping your regular medicines. This is especially important if you are taking diabetes medicines  or blood thinners. ? Taking medicines such as aspirin and ibuprofen. These medicines can thin your blood. Do not take these medicines before your procedure if your health care provider instructs you not to.  Follow instructions from your health care provider about eating or drinking restrictions.  You may be given antibiotic medicine to help prevent infection.  You may have an exam or testing, such as X-rays of the bladder, urethra, or kidneys.  You may have urine tests to check for signs of  infection.  Plan to have someone take you home after the procedure. What happens during the procedure?  To reduce your risk of infection,your health care team will wash or sanitize their hands.  You will be given one or more of the following: ? A medicine to help you relax (sedative). ? A medicine to numb the area (local anesthetic).  The area around the opening of your urethra will be cleaned.  The cystoscope will be passed through your urethra into your bladder.  Germ-free (sterile)fluid will flow through the cystoscope to fill your bladder. The fluid will stretch your bladder so that your surgeon can clearly examine your bladder walls.  The cystoscope will be removed and your bladder will be emptied. The procedure may vary among health care providers and hospitals. What happens after the procedure?  You may have some soreness or pain in your abdomen and urethra. Medicines will be available to help you.  You may have some blood in your urine.  Do not drive for 24 hours if you received a sedative. This information is not intended to replace advice given to you by your health care provider. Make sure you discuss any questions you have with your health care provider. Document Released: 04/03/2000 Document Revised: 08/15/2015 Document Reviewed: 02/21/2015 Elsevier Interactive Patient Education  2018 [employer].  Cystoscopy, Care After Refer to this sheet in the next few weeks. These instructions provide you with information about caring for yourself after your procedure. Your health care provider may also give you more specific instructions. Your treatment has been planned according to current medical practices, but problems sometimes occur. Call your health care provider if you have any problems or questions after your procedure. What can I expect after the procedure? After the procedure, it is common to have:  Mild pain when you urinate. Pain should stop within a few minutes  after you urinate. This may last for up to 1 week.  A small amount of blood in your urine for several days.  Feeling like you need to urinate but producing only a small amount of urine.  Follow these instructions at home:  Medicines  Take over-the-counter and prescription medicines only as told by your health care provider.  If you were prescribed an antibiotic medicine, take it as told by your health care provider. Do not stop taking the antibiotic even if you start to feel better. General instructions   Return to your normal activities as told by your health care provider. Ask your health care provider what activities are safe for you.  Do not drive for 24 hours if you received a sedative.  Watch for any blood in your urine. If the amount of blood in your urine increases, call your health care provider.  Follow instructions from your health care provider about eating or drinking restrictions.  If a tissue sample was removed for testing (biopsy) during your procedure, it is your responsibility to get your test results. Ask your health care provider  or the department performing the test when your results will be ready.  Drink enough fluid to keep your urine clear or pale yellow.  Keep all follow-up visits as told by your health care provider. This is important. Contact a health care provider if:  You have pain that gets worse or does not get better with medicine, especially pain when you urinate.  You have difficulty urinating. Get help right away if:  You have more blood in your urine.  You have blood clots in your urine.  You have abdominal pain.  You have a fever or chills.  You are unable to urinate. This information is not intended to replace advice given to you by your health care provider. Make sure you discuss any questions you have with your health care provider. Document Released: 10/24/2004 Document Revised: 09/12/2015 Document Reviewed: 02/21/2015 Elsevier  Interactive Patient Education  2018 [employer].  Ureteral Stent Implantation Ureteral stent implantation is a procedure to insert (implant) a flexible, soft, plastic tube (stent) into a tube (ureter) that drains urine from the kidneys. The stent supports the ureter while it heals and helps to drain urine from the kidneys. You may have a ureteral stent implanted after having a procedure to remove a blockage from the ureter (ureterolysis or pyeloplasty).You may also have a stent implanted to open the flow of urine when you have a blockage caused by a kidney stone, tumor, blood clot, or infection. You have two ureters, one on each side of the body. The ureters connect the kidneys to the organ that holds urine until it passes out of the body (bladder). The stent is placed so that one end is in the kidney, and one end is in the bladder. The stent is usually taken out after your ureter has healed. Depending on your condition, you may have a stent for just a few weeks, or you may have a long-term stent that will need to be replaced every few months. Tell a health care provider about:  Any allergies you have.  All medicines you are taking, including vitamins, herbs, eye drops, creams, and over-the-counter medicines.  Any problems you or family members have had with anesthetic medicines.  Any blood disorders you have.  Any surgeries you have had.  Any medical conditions you have.  Whether you are pregnant or may be pregnant. What are the risks? Generally, this is a safe procedure. However, problems may occur, including:  Infection.  Bleeding.  Allergic reactions to medicines.  Damage to other structures or organs. Tearing (perforation) of the ureter is possible.  Movement of the stent away from where it is placed during surgery (migration).  What happens before the procedure?  Ask your health care provider about: ? Changing or stopping your regular medicines. This is especially  important if you are taking diabetes medicines or blood thinners. ? Taking medicines such as aspirin and ibuprofen. These medicines can thin your blood. Do not take these medicines before your procedure if your health care provider instructs you not to.  Follow instructions from your health care provider about eating or drinking restrictions.  Do not drink alcohol and do not use any tobacco products before your procedure, as told by your health care provider.  You may be given antibiotic medicine to help prevent infection.  Plan to have someone take you home after the procedure.  If you go home right after the procedure, plan to have someone with you for 24 hours. What happens during the procedure?  An IV tube will be inserted into one of your veins.  You will be given a medicine to make you fall asleep (general anesthetic). You may also be given a medicine to help you relax (sedative).  A thin, tube-shaped instrument with a light and tiny camera at the end (cystoscope) will be inserted into your urethra. The urethra is the tube that drains urine from the bladder out of the body. In men, the urethra opens at the end of the penis. In women, the urethra opens in front of the vaginal opening.  The cystoscope will be passed into your bladder.  A thin wire (guide wire) will be passed through your bladder and into your ureter. This is used to guide the stent into your ureter.  The stent will be inserted into your ureter.  The guide wire and the cystoscope will be removed.  A flexible tube (catheter) will be inserted through your urethra so that one end is in your bladder. This helps to drain urine from your bladder. The procedure may vary among hospitals and health care providers. What happens after the procedure?  Your blood pressure, heart rate, breathing rate, and blood oxygen level will be monitored often until the medicines you were given have worn off.  You may continue to receive  medicine and fluids through an IV tube.  You may have some soreness or pain in your abdomen and urethra. Medicines will be available to help you.  You will be encouraged to get up and walk around as soon as you can.  You will have a catheter draining your urine.  You will have some blood in your urine.  Do not drive for 24 hours if you received a sedative. This information is not intended to replace advice given to you by your health care provider. Make sure you discuss any questions you have with your health care provider. Document Released: 04/03/2000 Document Revised: 09/12/2015 Document Reviewed: 10/19/2014 Elsevier Interactive Patient Education  2018 [place].  Ureteral Stent Implantation, Care After Refer to this sheet in the next few weeks. These instructions provide you with information about caring for yourself after your procedure. Your health care provider may also give you more specific instructions. Your treatment has been planned according to current medical practices, but problems sometimes occur. Call your health care provider if you have any problems or questions after your procedure. What can I expect after the procedure? After the procedure, it is common to have:  Nausea.  Mild pain when you urinate. You may feel this pain in your lower back or lower abdomen. Pain should stop within a few minutes after you urinate. This may last for up to 1 week.  A small amount of blood in your urine for several days.  Follow these instructions at home:  Medicines  Take over-the-counter and prescription medicines only as told by your health care provider.  If you were prescribed an antibiotic medicine, take it as told by your health care provider. Do not stop taking the antibiotic even if you start to feel better.  Do not drive for 24 hours if you received a sedative.  Do not drive or operate heavy machinery while taking prescription pain medicines. Activity  Return to  your normal activities as told by your health care provider. Ask your health care provider what activities are safe for you.  Do not lift anything that is heavier than 10 lb (4.5 kg). Follow this limit for 1 week after your procedure,  or for as long as told by your health care provider. General instructions  Watch for any blood in your urine. Call your health care provider if the amount of blood in your urine increases.  If you have a catheter: ? Follow instructions from your health care provider about taking care of your catheter and collection bag. ? Do not take baths, swim, or use a hot tub until your health care provider approves.  Drink enough fluid to keep your urine clear or pale yellow.  Keep all follow-up visits as told by your health care provider. This is important. Contact a health care provider if:  You have pain that gets worse or does not get better with medicine, especially pain when you urinate.  You have difficulty urinating.  You feel nauseous or you vomit repeatedly during a period of more than 2 days after the procedure. Get help right away if:  Your urine is dark red or has blood clots in it.  You are leaking urine (have incontinence).  The end of the stent comes out of your urethra.  You cannot urinate.  You have sudden, sharp, or severe pain in your abdomen or lower back.  You have a fever. This information is not intended to replace advice given to you by your health care provider. Make sure you discuss any questions you have with your health care provider. Document Released: 12/07/2012 Document Revised: 09/12/2015 Document Reviewed: 10/19/2014 Elsevier Interactive Patient Education  2018 [place] Anesthesia, Adult General anesthesia is the use of medicines to make a person "go to sleep" (be unconscious) for a medical procedure. General anesthesia is often recommended when a procedure:  Is long.  Requires you to be still or in an  unusual position.  Is major and can cause you to lose blood.  Is impossible to do without general anesthesia.  The medicines used for general anesthesia are called general anesthetics. In addition to making you sleep, the medicines:  Prevent pain.  Control your blood pressure.  Relax your muscles.  Tell a health care provider about:  Any allergies you have.  All medicines you are taking, including vitamins, herbs, eye drops, creams, and over-the-counter medicines.  Any problems you or family members have had with anesthetic medicines.  Types of anesthetics you have had in the past.  Any bleeding disorders you have.  Any surgeries you have had.  Any medical conditions you have.  Any history of heart or lung conditions, such as heart failure, sleep apnea, or chronic obstructive pulmonary disease (COPD).  Whether you are pregnant or may be pregnant.  Whether you use tobacco, alcohol, marijuana, or street drugs.  Any history of Armed forces logistics/support/administrative officer.  Any history of depression or anxiety. What are the risks? Generally, this is a safe procedure. However, problems may occur, including:  Allergic reaction to anesthetics.  Lung and heart problems.  Inhaling food or liquids from your stomach into your lungs (aspiration).  Injury to nerves.  Waking up during your procedure and being unable to move (rare).  Extreme agitation or a state of mental confusion (delirium) when you wake up from the anesthetic.  Air in the bloodstream, which can lead to stroke.  These problems are more likely to develop if you are having a major surgery or if you have an advanced medical condition. You can prevent some of these complications by answering all of your health care provider's questions thoroughly and by following all pre-procedure instructions. General anesthesia can cause  side effects, including:  Nausea or vomiting  A sore throat from the breathing tube.  Feeling cold or  shivery.  Feeling tired, washed out, or achy.  Sleepiness or drowsiness.  Confusion or agitation.  What happens before the procedure? Staying hydrated Follow instructions from your health care provider about hydration, which may include:  Up to 2 hours before the procedure - you may continue to drink clear liquids, such as water, clear fruit juice, black coffee, and plain tea.  Eating and drinking restrictions Follow instructions from your health care provider about eating and drinking, which may include:  8 hours before the procedure - stop eating heavy meals or foods such as meat, fried foods, or fatty foods.  6 hours before the procedure - stop eating light meals or foods, such as toast or cereal.  6 hours before the procedure - stop drinking milk or drinks that contain milk.  2 hours before the procedure - stop drinking clear liquids.  Medicines  Ask your health care provider about: ? Changing or stopping your regular medicines. This is especially important if you are taking diabetes medicines or blood thinners. ? Taking medicines such as aspirin and ibuprofen. These medicines can thin your blood. Do not take these medicines before your procedure if your health care provider instructs you not to. ? Taking new dietary supplements or medicines. Do not take these during the week before your procedure unless your health care provider approves them.  If you are told to take a medicine or to continue taking a medicine on the day of the procedure, take the medicine with sips of water. General instructions   Ask if you will be going home the same day, the following day, or after a longer hospital stay. ? Plan to have someone take you home. ? Plan to have someone stay with you for the first 24 hours after you leave the hospital or clinic.  For 3-6 weeks before the procedure, try not to use any tobacco products, such as cigarettes, chewing tobacco, and e-cigarettes.  You may brush  your teeth on the morning of the procedure, but make sure to spit out the toothpaste. What happens during the procedure?  You will be given anesthetics through a mask and through an IV tube in one of your veins.  You may receive medicine to help you relax (sedative).  As soon as you are asleep, a breathing tube may be used to help you breathe.  An anesthesia specialist will stay with you throughout the procedure. He or she will help keep you comfortable and safe by continuing to give you medicines and adjusting the amount of medicine that you get. He or she will also watch your blood pressure, pulse, and oxygen levels to make sure that the anesthetics do not cause any problems.  If a breathing tube was used to help you breathe, it will be removed before you wake up. The procedure may vary among health care providers and hospitals. What happens after the procedure?  You will wake up, often slowly, after the procedure is complete, usually in a recovery area.  Your blood pressure, heart rate, breathing rate, and blood oxygen level will be monitored until the medicines you were given have worn off.  You may be given medicine to help you calm down if you feel anxious or agitated.  If you will be going home the same day, your health care provider may check to make sure you can stand, drink, and urinate.  Your health care providers will treat your pain and side effects before you go home.  Do not drive for 24 hours if you received a sedative.  You may: ? Feel nauseous and vomit. ? Have a sore throat. ? Have mental slowness. ? Feel cold or shivery. ? Feel sleepy. ? Feel tired. ? Feel sore or achy, even in parts of your body where you did not have surgery. This information is not intended to replace advice given to you by your health care provider. Make sure you discuss any questions you have with your health care provider. Document Released: 07/14/2007 Document Revised: 09/17/2015  Document Reviewed: 03/21/2015 Elsevier Interactive Patient Education  2018 [place] Anesthesia, Adult, Care After These instructions provide you with information about caring for yourself after your procedure. Your health care provider may also give you more specific instructions. Your treatment has been planned according to current medical practices, but problems sometimes occur. Call your health care provider if you have any problems or questions after your procedure. What can I expect after the procedure? After the procedure, it is common to have:  Vomiting.  A sore throat.  Mental slowness.  It is common to feel:  Nauseous.  Cold or shivery.  Sleepy.  Tired.  Sore or achy, even in parts of your body where you did not have surgery.  Follow these instructions at home: For at least 24 hours after the procedure:  Do not: ? Participate in activities where you could fall or become injured. ? Drive. ? Use heavy machinery. ? Drink alcohol. ? Take sleeping pills or medicines that cause drowsiness. ? Make important decisions or sign legal documents. ? Take care of children on your own.  Rest. Eating and drinking  If you vomit, drink water, juice, or soup when you can drink without vomiting.  Drink enough fluid to keep your urine clear or pale yellow.  Make sure you have little or no nausea before eating solid foods.  Follow the diet recommended by your health care provider. General instructions  Have a responsible adult stay with you until you are awake and alert.  Return to your normal activities as told by your health care provider. Ask your health care provider what activities are safe for you.  Take over-the-counter and prescription medicines only as told by your health care provider.  If you smoke, do not smoke without supervision.  Keep all follow-up visits as told by your health care provider. This is important. Contact a health care provider  if:  You continue to have nausea or vomiting at home, and medicines are not helpful.  You cannot drink fluids or start eating again.  You cannot urinate after 8-12 hours.  You develop a skin rash.  You have fever.  You have increasing redness at the site of your procedure. Get help right away if:  You have difficulty breathing.  You have chest pain.  You have unexpected bleeding.  You feel that you are having a life-threatening or urgent problem. This information is not intended to replace advice given to you by your health care provider. Make sure you discuss any questions you have with your health care provider. Document Released: 07/13/2000 Document Revised: 09/09/2015 Document Reviewed: 03/21/2015 Elsevier Interactive Patient Education  [employer].

## 2017-11-02 ENCOUNTER — Other Ambulatory Visit: Payer: Self-pay

## 2017-11-02 ENCOUNTER — Encounter (HOSPITAL_COMMUNITY)
Admission: RE | Admit: 2017-11-02 | Discharge: 2017-11-02 | Disposition: A | Discharge status: Home / Self Care | Payer: PRIVATE HEALTH INSURANCE | Source: Ambulatory Visit | Attending: Urology | Admitting: Urology

## 2017-11-02 ENCOUNTER — Encounter (HOSPITAL_COMMUNITY): Payer: Self-pay

## 2017-11-02 HISTORY — DX: Depression, unspecified: F32.A

## 2017-11-02 HISTORY — DX: Major depressive disorder, single episode, unspecified: F32.9

## 2017-11-02 HISTORY — DX: Prediabetes: R73.03

## 2017-11-03 ENCOUNTER — Encounter (HOSPITAL_COMMUNITY): Payer: Self-pay | Admitting: *Deleted

## 2017-11-03 ENCOUNTER — Encounter (HOSPITAL_COMMUNITY)
Admission: RE | Disposition: A | Discharge status: Home / Self Care | Payer: Self-pay | Source: Ambulatory Visit | Attending: Urology

## 2017-11-03 ENCOUNTER — Ambulatory Visit (HOSPITAL_COMMUNITY)
Admission: RE | Admit: 2017-11-03 | Discharge: 2017-11-03 | Disposition: A | Discharge status: Home / Self Care | Payer: PRIVATE HEALTH INSURANCE | Source: Ambulatory Visit | Attending: Urology | Admitting: Urology

## 2017-11-03 ENCOUNTER — Ambulatory Visit (HOSPITAL_COMMUNITY): Payer: PRIVATE HEALTH INSURANCE | Admitting: Anesthesiology

## 2017-11-03 ENCOUNTER — Ambulatory Visit (HOSPITAL_COMMUNITY): Payer: PRIVATE HEALTH INSURANCE

## 2017-11-03 DIAGNOSIS — F329 Major depressive disorder, single episode, unspecified: Secondary | ICD-10-CM | POA: Insufficient documentation

## 2017-11-03 DIAGNOSIS — N132 Hydronephrosis with renal and ureteral calculous obstruction: Secondary | ICD-10-CM | POA: Diagnosis present

## 2017-11-03 DIAGNOSIS — N201 Calculus of ureter: Secondary | ICD-10-CM

## 2017-11-03 DIAGNOSIS — I10 Essential (primary) hypertension: Secondary | ICD-10-CM | POA: Insufficient documentation

## 2017-11-03 DIAGNOSIS — K219 Gastro-esophageal reflux disease without esophagitis: Secondary | ICD-10-CM | POA: Insufficient documentation

## 2017-11-03 DIAGNOSIS — Z720 Tobacco use: Secondary | ICD-10-CM | POA: Diagnosis not present

## 2017-11-03 DIAGNOSIS — Z885 Allergy status to narcotic agent status: Secondary | ICD-10-CM | POA: Diagnosis not present

## 2017-11-03 DIAGNOSIS — F419 Anxiety disorder, unspecified: Secondary | ICD-10-CM | POA: Diagnosis not present

## 2017-11-03 HISTORY — PX: CYSTOSCOPY WITH RETROGRADE PYELOGRAM, URETEROSCOPY AND STENT PLACEMENT: SHX5789

## 2017-11-03 SURGERY — CYSTOURETEROSCOPY, WITH RETROGRADE PYELOGRAM AND STENT INSERTION
Anesthesia: General | Site: Ureter | Laterality: Right

## 2017-11-03 MED ORDER — FENTANYL CITRATE (PF) 100 MCG/2ML IJ SOLN: 50.0000 ug | Freq: Once | INTRAMUSCULAR | Status: DC

## 2017-11-03 MED ORDER — FENTANYL CITRATE (PF) 100 MCG/2ML IJ SOLN
INTRAMUSCULAR | Status: AC
  Filled 2017-11-03: qty 2

## 2017-11-03 MED ORDER — FENTANYL CITRATE (PF) 100 MCG/2ML IJ SOLN
25.0000 ug | INTRAMUSCULAR | Status: DC | PRN
  Administered 2017-11-03 (×2): 50 ug via INTRAVENOUS
  Filled 2017-11-03: qty 2

## 2017-11-03 MED ORDER — DIATRIZOATE MEGLUMINE 30 % UR SOLN
URETHRAL | Status: DC | PRN
  Administered 2017-11-03: 10 mL via URETHRAL

## 2017-11-03 MED ORDER — DIATRIZOATE MEGLUMINE 30 % UR SOLN
URETHRAL | Status: AC
  Filled 2017-11-03: qty 100

## 2017-11-03 MED ORDER — ONDANSETRON HCL 4 MG/2ML IJ SOLN
INTRAMUSCULAR | Status: DC | PRN
  Administered 2017-11-03: 4 mg via INTRAVENOUS

## 2017-11-03 MED ORDER — HYDROCODONE-ACETAMINOPHEN 7.5-325 MG PO TABS: 1.0000 | ORAL_TABLET | Freq: Once | ORAL | Status: DC | PRN

## 2017-11-03 MED ORDER — MIDAZOLAM HCL 5 MG/5ML IJ SOLN
INTRAMUSCULAR | Status: DC | PRN
  Administered 2017-11-03 (×2): 2 mg via INTRAVENOUS

## 2017-11-03 MED ORDER — FENTANYL CITRATE (PF) 100 MCG/2ML IJ SOLN
INTRAMUSCULAR | Status: DC | PRN
  Administered 2017-11-03 (×2): 50 ug via INTRAVENOUS

## 2017-11-03 MED ORDER — MIDAZOLAM HCL 2 MG/2ML IJ SOLN
INTRAMUSCULAR | Status: AC
  Filled 2017-11-03: qty 2

## 2017-11-03 MED ORDER — SODIUM CHLORIDE 0.9 % IR SOLN
Status: DC | PRN
  Administered 2017-11-03 (×2): 3000 mL

## 2017-11-03 MED ORDER — CEFAZOLIN SODIUM-DEXTROSE 2-4 GM/100ML-% IV SOLN
2.0000 g | INTRAVENOUS | Status: DC
  Administered 2017-11-03: 3 g via INTRAVENOUS

## 2017-11-03 MED ORDER — WATER FOR IRRIGATION, STERILE IR SOLN
Status: DC | PRN
  Administered 2017-11-03: 1000 mL

## 2017-11-03 MED ORDER — OXYCODONE-ACETAMINOPHEN 10-325 MG PO TABS: 1.0000 | ORAL_TABLET | ORAL | 0 refills | Status: DC | PRN

## 2017-11-03 MED ORDER — PROPOFOL 10 MG/ML IV BOLUS
INTRAVENOUS | Status: AC
  Filled 2017-11-03: qty 20

## 2017-11-03 MED ORDER — DEXTROSE 5 % IV SOLN
3.0000 g | INTRAVENOUS | Status: DC
  Filled 2017-11-03: qty 3000

## 2017-11-03 MED ORDER — LACTATED RINGERS IV SOLN
INTRAVENOUS | Status: DC
  Administered 2017-11-03: 13:00:00 via INTRAVENOUS

## 2017-11-03 MED ORDER — ONDANSETRON HCL 4 MG/2ML IJ SOLN
INTRAMUSCULAR | Status: AC
  Filled 2017-11-03: qty 2

## 2017-11-03 MED ORDER — PROPOFOL 10 MG/ML IV BOLUS
INTRAVENOUS | Status: DC | PRN
  Administered 2017-11-03: 30 mg via INTRAVENOUS
  Administered 2017-11-03: 210 mg via INTRAVENOUS

## 2017-11-03 SURGICAL SUPPLY — 24 items
BAG DRAIN URO TABLE W/ADPT NS (BAG) ×4 IMPLANT
BAG DRN 8 ADPR NS SKTRN CSTL (BAG) ×2
CATH INTERMIT  6FR 70CM (CATHETERS) ×4 IMPLANT
CLOTH BEACON ORANGE TIMEOUT ST (SAFETY) ×4 IMPLANT
DECANTER SPIKE VIAL GLASS SM (MISCELLANEOUS) ×4 IMPLANT
EXTRACTOR STONE NITINOL NGAGE (UROLOGICAL SUPPLIES) ×4 IMPLANT
GLOVE BIO SURGEON STRL SZ8 (GLOVE) ×4 IMPLANT
GLOVE BIOGEL PI IND STRL 7.0 (GLOVE) ×3 IMPLANT
GLOVE BIOGEL PI INDICATOR 7.0 (GLOVE) ×4
GLOVE ECLIPSE 6.5 STRL STRAW (GLOVE) ×6 IMPLANT
GOWN STRL REUS W/ TWL XL LVL3 (GOWN DISPOSABLE) ×2 IMPLANT
GOWN STRL REUS W/TWL LRG LVL3 (GOWN DISPOSABLE) ×4 IMPLANT
GOWN STRL REUS W/TWL XL LVL3 (GOWN DISPOSABLE) ×4
GUIDEWIRE STR DUAL SENSOR (WIRE) ×4 IMPLANT
GUIDEWIRE STR ZIPWIRE 035X150 (MISCELLANEOUS) ×4 IMPLANT
IV NS IRRIG 3000ML ARTHROMATIC (IV SOLUTION) ×8 IMPLANT
KIT TURNOVER CYSTO (KITS) ×4 IMPLANT
MANIFOLD NEPTUNE II (INSTRUMENTS) ×4 IMPLANT
PACK CYSTO (CUSTOM PROCEDURE TRAY) ×4 IMPLANT
PAD ARMBOARD 7.5X6 YLW CONV (MISCELLANEOUS) ×4 IMPLANT
STENT URET 6FRX26 CONTOUR (STENTS) IMPLANT
SYR 10ML LL (SYRINGE) ×4 IMPLANT
TOWEL OR 17X26 4PK STRL BLUE (TOWEL DISPOSABLE) ×4 IMPLANT
WATER STERILE IRR 500ML POUR (IV SOLUTION) ×4 IMPLANT

## 2017-11-03 NOTE — Discharge Instructions (Signed)
Ureteroscopy, Care After This sheet gives you information about how to care for yourself after your procedure. Your health care provider may also give you more specific instructions. If you have problems or questions, contact your health care provider. What can I expect after the procedure? After the procedure, it is common to have:  A burning sensation when you urinate.  Blood in your urine.  Mild discomfort in the bladder area or kidney area when urinating.  Needing to urinate more often or urgently.  Follow these instructions at home: Medicines  Take over-the-counter and prescription medicines only as told by your health care provider.  If you were prescribed an antibiotic medicine, take it as told by your health care provider. Do not stop taking the antibiotic even if you start to feel better. General instructions   Donot drive for 24 hours if you were given a medicine to help you relax (sedative) during your procedure.  To relieve burning, try taking a warm bath or holding a warm washcloth over your groin.  Drink enough fluid to keep your urine clear or pale yellow. ? Drink two 8-ounce glasses of water every hour for the first 2 hours after you get home. ? Continue to drink water often at home.  You can eat what you usually do.  Keep all follow-up visits as told by your health care provider. This is important. ? If you had a tube placed to keep urine flowing (ureteral stent), ask your health care provider when you need to return to have it removed. Contact a health care provider if:  You have chills or a fever.  You have burning pain for longer than 24 hours after the procedure.  You have blood in your urine for longer than 24 hours after the procedure. Get help right away if:  You have large amounts of blood in your urine.  You have blood clots in your urine.  You have very bad pain.  You have chest pain or trouble breathing.  You are unable to urinate and  you have the feeling of a full bladder. This information is not intended to replace advice given to you by your health care provider. Make sure you discuss any questions you have with your health care provider. Document Released: 04/11/2013 Document Revised: 01/21/2016 Document Reviewed: 01/17/2016 Elsevier Interactive Patient Education  [employer].

## 2017-11-03 NOTE — Anesthesia Procedure Notes (Signed)
Procedure Name: LMA Insertion Date/Time: 11/03/2017 1:59 PM Performed by: Charmaine Downs, CRNA Pre-anesthesia Checklist: Patient identified, Patient being monitored, Emergency Drugs available, Timeout performed and Suction available Patient Re-evaluated:Patient Re-evaluated prior to induction Oxygen Delivery Method: Circle System Utilized Preoxygenation: Pre-oxygenation with 100% oxygen Induction Type: IV induction Ventilation: Mask ventilation without difficulty LMA: LMA inserted LMA Size: 4.0 Number of attempts: 1 Placement Confirmation: positive ETCO2 and breath sounds checked- equal and bilateral

## 2017-11-03 NOTE — Transfer of Care (Signed)
Immediate Anesthesia Transfer of Care Note  Patient: Matthew Vance  Procedure(s) Performed: CYSTOSCOPY WITH RIGHT RETROGRADE PYELOGRAM, URETEROSCOPY AND STONE EXTRACTION (Right Ureter)  Patient Location: PACU  Anesthesia Type:General  Level of Consciousness: awake and patient cooperative  Airway & Oxygen Therapy: Patient Spontanous Breathing and Patient connected to nasal cannula oxygen  Post-op Assessment: Report given to RN, Post -op Vital signs reviewed and stable and Patient moving all extremities  Post vital signs: Reviewed and stable  Last Vitals:  Vitals Value Taken Time  BP    Temp    Pulse 81 11/03/2017  2:34 PM  Resp    SpO2 96 % 11/03/2017  2:34 PM  Vitals shown include unvalidated device data.  Last Pain:  Vitals:   11/03/17 1300  TempSrc:   PainSc: 5       Patients Stated Pain Goal: 8 (91/50/41 3643)  Complications: No apparent anesthesia complications

## 2017-11-03 NOTE — Anesthesia Procedure Notes (Signed)
Performed by: Zarif Rathje J, CRNA       

## 2017-11-03 NOTE — Op Note (Signed)
Preoperative diagnosis: Right ureteral stone  Postoperative diagnosis: Same  Procedure: 1 cystoscopy 2 right retrograde pyelography 3.  Intraoperative fluoroscopy, under one hour, with interpretation 4.  Right ureteroscopic stone manipulation with basket extraction   Attending: Rosie Fate  Anesthesia: General  Estimated blood loss: None  Drains: none  Specimens: stone for analysis  Antibiotics: ancef  Findings: right 46m UVJ calculus. Moderate hydronephrosis.  Indications: Patient is a 37year old male with a history of ureteral stone and who has failed medical expulsive therapy.  After discussing treatment options, she decided proceed with right ureteroscopic stone manipulation.  Procedure her in detail: The patient was brought to the operating room and a brief timeout was done to ensure correct patient, correct procedure, correct site.  General anesthesia was administered patient was placed in dorsal lithotomy position.  Her genitalia was then prepped and draped in usual sterile fashion.  A rigid 2107French cystoscope was passed in the urethra and the bladder.  Bladder was inspected free masses or lesions.  the right ureteral orifices were in the normal orthotopic locations.  a 6 french ureteral catheter was then instilled into the right ureter orifice.  a gentle retrograde was obtained and findings noted above.  we then placed a zip wire through the ureteral catheter and advanced up to the renal pelvis.  we then removed the cystoscope and cannulated the right ureteral orifice with a semirigid ureteroscope.  we then encountered the stone in the distal ureter and then removed it with a engage basket. Since this was an uncomplicated ureteroscopy we elected not to leave a stent.  the bladder was then drained and this concluded the procedure which was well tolerated by patient.  Complications: None  Condition: Stable, extubated, transferred to PACU  Plan: Patient is to be  discharged home as to follow-up in 2 weeks.

## 2017-11-03 NOTE — H&P (Signed)
Urology Admission H&P  Chief Complaint: right flank pain  History of Present Illness: Matthew Vance is a 37yo with a hx of nephrolithiasis who has a 47m distal calculus and has failed medical expulsive therapy. He has intermittent sharp moderate non radiating right flank pain. No other symptoms  Past Medical History:  Diagnosis Date  . Anxiety   . Depression   . GERD (gastroesophageal reflux disease)   . Hypertension   . Migraine   . Pre-diabetes   . Renal disorder    kidney stone   Past Surgical History:  Procedure Laterality Date  . CHOLECYSTECTOMY  06/14/15   Dr. RKarlyn Agee path: cholelithiasis with chronic cholecystitis  . COLONOSCOPY  06/04/15   Dr. CDoristine Mango normal  . COLONOSCOPY WITH PROPOFOL N/A 09/26/2015   next tcs in 5 years for tubular adenoma. random colon bx negative. subtle muscosal changes of TI with negative bx, int hemorrhoids.   . ESOPHAGOGASTRODUODENOSCOPY  06/27/15   Dr. RKarlyn Agee gastritis  . HAND SURGERY Right    trigger finger  . HERNIA REPAIR     umbilical hernia  . KNEE SURGERY Right    arthroscopy x4  . LYMPH NODE DISSECTION     2 removed in left neck, "bonded together"    Home Medications:  Current Facility-Administered Medications  Medication Dose Route Frequency Provider Last Rate Last Dose  . ceFAZolin (ANCEF) 3 g in dextrose 5 % 50 mL IVPB  3 g Intravenous 30 min Pre-Op Zimri Brennen, PCandee Furbish MD      . lactated ringers infusion   Intravenous Continuous NLenice Llamas MD 50 mL/hr at 11/03/17 1256     Allergies:  Allergies  Allergen Reactions  . Toradol [Ketorolac Tromethamine] Other (See Comments)    Chest pain    Family History  Problem Relation Age of Onset  . Irritable bowel syndrome Sister   . Colitis Maternal Aunt        had colostomy  . Irritable bowel syndrome Paternal Grandmother   . Crohn's disease Neg Hx   . Colon cancer Neg Hx   . Celiac disease Neg Hx   . Migraines Neg Hx    Social History:  reports  that he has never smoked. He uses smokeless tobacco. He reports that he drinks alcohol. He reports that he does not use drugs.  Review of Systems  Genitourinary: Positive for flank pain.  All other systems reviewed and are negative.   Physical Exam:  Vital signs in last 24 hours: Temp:  [99 F (37.2 C)-99.3 F (37.4 C)] 99 F (37.2 C) (07/17 1251) Pulse Rate:  [86] 86 (07/16 1437) Resp:  [16] 16 (07/17 1251) BP: (149-172)/(84-91) 172/91 (07/17 1251) SpO2:  [95 %-97 %] 95 % (07/17 1251) Weight:  [156.5 kg (345 lb)] 156.5 kg (345 lb) (07/17 1251) Physical Exam  Constitutional: He is oriented to person, place, and time. He appears well-developed and well-nourished.  HENT:  Head: Normocephalic and atraumatic.  Eyes: Pupils are equal, round, and reactive to light. EOM are normal.  Neck: Normal range of motion. No thyromegaly present.  Cardiovascular: Normal rate and regular rhythm.  Respiratory: Effort normal. No respiratory distress.  GI: Soft. He exhibits no distension.  Musculoskeletal: Normal range of motion. He exhibits no edema.  Neurological: He is alert and oriented to person, place, and time.  Skin: Skin is warm and dry.  Psychiatric: He has a normal mood and affect. His behavior is normal. Judgment and thought content normal.  Laboratory Data:  No results found for this or any previous visit (from the past 24 hour(s)). No results found for this or any previous visit (from the past 240 hour(s)). Creatinine: No results for input(s): CREATININE in the last 168 hours. Baseline Creatinine: unknown  Impression/Assessment:  36yo with right ureteral calculus  Plan:  The risks/benefits/alternatives to right ureteroscopic stone extraction was explained to the patient and he understands and wishes to proceed with surgery  Nicolette Bang 11/03/2017, 1:12 PM

## 2017-11-03 NOTE — Anesthesia Postprocedure Evaluation (Signed)
Anesthesia Post Note  Patient: Matthew Vance  Procedure(s) Performed: CYSTOSCOPY WITH RIGHT RETROGRADE PYELOGRAM, URETEROSCOPY AND STONE EXTRACTION (Right Ureter)  Patient location during evaluation: PACU Anesthesia Type: General Level of consciousness: awake and patient cooperative Pain management: pain level controlled Vital Signs Assessment: post-procedure vital signs reviewed and stable Respiratory status: spontaneous breathing, nonlabored ventilation and respiratory function stable Cardiovascular status: blood pressure returned to baseline Postop Assessment: no apparent nausea or vomiting Anesthetic complications: no     Last Vitals:  Vitals:   11/03/17 1433 11/03/17 1445  BP: (!) 138/97 128/81  Pulse: 81 79  Resp: 15 15  Temp: 36.7 C   SpO2: 96% 97%    Last Pain:  Vitals:   11/03/17 1433  TempSrc:   PainSc: 7                  [name]

## 2017-11-03 NOTE — Anesthesia Preprocedure Evaluation (Signed)
Anesthesia Evaluation  Patient identified by MRN, date of birth, ID band Patient awake    Reviewed: Allergy & Precautions, NPO status , Patient's Chart, lab work & pertinent test results  Airway Mallampati: II  TM Distance: >3 FB Neck ROM: Full    Dental no notable dental hx.    Pulmonary neg pulmonary ROS,    Pulmonary exam normal breath sounds clear to auscultation       Cardiovascular Exercise Tolerance: Good hypertension, Pt. on medications negative cardio ROS Normal cardiovascular examI Rhythm:Regular Rate:Normal     Neuro/Psych  Headaches, Anxiety Depression negative neurological ROS  negative psych ROS   GI/Hepatic negative GI ROS, Neg liver ROS, GERD  Medicated and Controlled,  Endo/Other  negative endocrine ROS  Renal/GU Renal diseasenegative Renal ROSStones   negative genitourinary   Musculoskeletal negative musculoskeletal ROS (+)   Abdominal   Peds negative pediatric ROS (+)  Hematology negative hematology ROS (+)   Anesthesia Other Findings   Reproductive/Obstetrics negative OB ROS                             Anesthesia Physical Anesthesia Plan  ASA: II  Anesthesia Plan: General   Post-op Pain Management:    Induction: Intravenous  PONV Risk Score and Plan:   Airway Management Planned: LMA  Additional Equipment:   Intra-op Plan:   Post-operative Plan: Extubation in OR  Informed Consent: I have reviewed the patients History and Physical, chart, labs and discussed the procedure including the risks, benefits and alternatives for the proposed anesthesia with the patient or authorized representative who has indicated his/her understanding and acceptance.   Dental advisory given  Plan Discussed with: CRNA  Anesthesia Plan Comments: (LMA vs GETA as needed )        Anesthesia Quick Evaluation

## 2017-11-04 ENCOUNTER — Encounter (HOSPITAL_COMMUNITY): Payer: Self-pay | Admitting: Urology

## 2017-11-09 LAB — STONE ANALYSIS
Ca Oxalate,Dihydrate: 25 %
Ca Oxalate,Monohydr.: 60 %
Ca phos cry stone ql IR: 15 %
Stone Weight KSTONE: 13.2 mg

## 2017-11-10 ENCOUNTER — Ambulatory Visit: Payer: PRIVATE HEALTH INSURANCE | Admitting: Urology

## 2017-11-21 ENCOUNTER — Emergency Department (HOSPITAL_COMMUNITY)
Admission: EM | Admit: 2017-11-21 | Discharge: 2017-11-21 | Disposition: A | Discharge status: Home / Self Care | Payer: PRIVATE HEALTH INSURANCE | Attending: Emergency Medicine | Admitting: Emergency Medicine

## 2017-11-21 ENCOUNTER — Other Ambulatory Visit: Payer: Self-pay

## 2017-11-21 ENCOUNTER — Encounter (HOSPITAL_COMMUNITY): Payer: Self-pay | Admitting: Emergency Medicine

## 2017-11-21 ENCOUNTER — Emergency Department (HOSPITAL_COMMUNITY): Payer: PRIVATE HEALTH INSURANCE

## 2017-11-21 DIAGNOSIS — K432 Incisional hernia without obstruction or gangrene: Secondary | ICD-10-CM

## 2017-11-21 DIAGNOSIS — I10 Essential (primary) hypertension: Secondary | ICD-10-CM | POA: Insufficient documentation

## 2017-11-21 DIAGNOSIS — Z79899 Other long term (current) drug therapy: Secondary | ICD-10-CM | POA: Diagnosis not present

## 2017-11-21 DIAGNOSIS — R319 Hematuria, unspecified: Secondary | ICD-10-CM

## 2017-11-21 DIAGNOSIS — R1031 Right lower quadrant pain: Secondary | ICD-10-CM | POA: Diagnosis present

## 2017-11-21 LAB — URINALYSIS, ROUTINE W REFLEX MICROSCOPIC
BILIRUBIN URINE: NEGATIVE
Glucose, UA: 50 mg/dL — AB
Ketones, ur: NEGATIVE mg/dL
LEUKOCYTES UA: NEGATIVE
Nitrite: NEGATIVE
PH: 5 (ref 5.0–8.0)
Protein, ur: NEGATIVE mg/dL
SPECIFIC GRAVITY, URINE: 1.026 (ref 1.005–1.030)

## 2017-11-21 LAB — CBC WITH DIFFERENTIAL/PLATELET
BASOS ABS: 0 10*3/uL (ref 0.0–0.1)
BASOS PCT: 0 %
EOS ABS: 0.1 10*3/uL (ref 0.0–0.7)
Eosinophils Relative: 2 %
HCT: 40.7 % (ref 39.0–52.0)
Hemoglobin: 14.3 g/dL (ref 13.0–17.0)
Lymphocytes Relative: 24 %
Lymphs Abs: 1.9 10*3/uL (ref 0.7–4.0)
MCH: 31.5 pg (ref 26.0–34.0)
MCHC: 35.1 g/dL (ref 30.0–36.0)
MCV: 89.6 fL (ref 78.0–100.0)
MONO ABS: 0.7 10*3/uL (ref 0.1–1.0)
Monocytes Relative: 9 %
NEUTROS PCT: 65 %
Neutro Abs: 5.1 10*3/uL (ref 1.7–7.7)
PLATELETS: 176 10*3/uL (ref 150–400)
RBC: 4.54 MIL/uL (ref 4.22–5.81)
RDW: 12.7 % (ref 11.5–15.5)
WBC: 7.9 10*3/uL (ref 4.0–10.5)

## 2017-11-21 LAB — BASIC METABOLIC PANEL
Anion gap: 11 (ref 5–15)
BUN: 12 mg/dL (ref 6–20)
CALCIUM: 8.7 mg/dL — AB (ref 8.9–10.3)
CO2: 25 mmol/L (ref 22–32)
Chloride: 97 mmol/L — ABNORMAL LOW (ref 98–111)
Creatinine, Ser: 0.83 mg/dL (ref 0.61–1.24)
GFR calc non Af Amer: >60 mL/min (ref >60)
Glucose, Bld: 184 mg/dL — ABNORMAL HIGH (ref 70–99)
Potassium: 3.4 mmol/L — ABNORMAL LOW (ref 3.5–5.1)
SODIUM: 133 mmol/L — AB (ref 135–145)

## 2017-11-21 MED ORDER — TAMSULOSIN HCL 0.4 MG PO CAPS
0.4000 mg | ORAL_CAPSULE | Freq: Once | ORAL | Status: AC
  Administered 2017-11-21: 0.4 mg via ORAL
  Filled 2017-11-21: qty 1

## 2017-11-21 MED ORDER — HYDROMORPHONE HCL 1 MG/ML IJ SOLN
1.0000 mg | Freq: Once | INTRAMUSCULAR | Status: AC
  Administered 2017-11-21: 1 mg via INTRAVENOUS
  Filled 2017-11-21: qty 1

## 2017-11-21 MED ORDER — FENTANYL CITRATE (PF) 100 MCG/2ML IJ SOLN
50.0000 ug | Freq: Once | INTRAMUSCULAR | Status: AC
  Administered 2017-11-21: 50 ug via INTRAVENOUS
  Filled 2017-11-21: qty 2

## 2017-11-21 MED ORDER — HYDROMORPHONE HCL 1 MG/ML IJ SOLN
0.5000 mg | Freq: Once | INTRAMUSCULAR | Status: AC
  Administered 2017-11-21: 0.5 mg via INTRAVENOUS
  Filled 2017-11-21: qty 1

## 2017-11-21 MED ORDER — ONDANSETRON HCL 4 MG/2ML IJ SOLN
4.0000 mg | Freq: Once | INTRAMUSCULAR | Status: AC
  Administered 2017-11-21: 4 mg via INTRAVENOUS
  Filled 2017-11-21: qty 2

## 2017-11-21 MED ORDER — OXYCODONE-ACETAMINOPHEN 5-325 MG PO TABS: 1.0000 | ORAL_TABLET | ORAL | 0 refills | Status: DC | PRN

## 2017-11-21 NOTE — ED Provider Notes (Signed)
[facility] EMERGENCY DEPARTMENT Provider Note   CSN: [id number] Arrival date & time: 11/21/17  1839     History   Chief Complaint Chief Complaint  Patient presents with  . Flank Pain    HPI Matthew Vance is a 37 y.o. male presenting with sudden onset of RLQ abdominal pain which started about 1 hour before arriving here.  He underwent a right cystoscopy right ureteroscopic stone removal using basket extraction on 7/17 by Dr. Alyson Ingles and has been released from his care for prn f/u.  His CT scan (7/8 at [facility]) revealed a right 6 mm renal stone.  He reports his current pain is similar to kidney stone pain, but he denies having flank pain, hematuria or dysuria with this current complaint. He reports nausea but no vomiting, no fevers.  He has had no treatment prior to arrival.  The history is provided by the patient and the spouse.    Past Medical History:  Diagnosis Date  . Anxiety   . Depression   . GERD (gastroesophageal reflux disease)   . Hypertension   . Migraine   . Pre-diabetes   . Renal disorder    kidney stone    Patient Active Problem List   Diagnosis Date Noted  . Migraine with aura and without status migrainosus 10/13/2017  . 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 RLQ abdominal pain 10/11/2015  . History of colonic polyps   . Abnormal computed tomography of cecum and terminal ileum   . Abnormal weight loss 09/11/2015  . Rectal bleeding 09/11/2015  . Abdominal pain 06/22/2013  . Hyponatremia 06/22/2013  . Hyperglycemia 06/22/2013  . Nausea with vomiting 06/22/2013  . Hematuria, microscopic 06/22/2013  . Diarrhea 06/22/2013    Past Surgical History:  Procedure Laterality Date  . CHOLECYSTECTOMY  06/14/15   Dr. Karlyn Agee: path: cholelithiasis with chronic cholecystitis  . COLONOSCOPY  06/04/15   Dr. Doristine Mango: normal  . COLONOSCOPY WITH PROPOFOL N/A 09/26/2015   next tcs in 5 years for tubular adenoma. random colon bx negative. subtle muscosal  changes of TI with negative bx, int hemorrhoids.   . CYSTOSCOPY WITH RETROGRADE PYELOGRAM, URETEROSCOPY AND STENT PLACEMENT Right 11/03/2017   Procedure: CYSTOSCOPY WITH RIGHT RETROGRADE PYELOGRAM, URETEROSCOPY AND STONE EXTRACTION;  Surgeon: Cleon Gustin, MD;  Location: [facility];  Service: Urology;  Laterality: Right;  . ESOPHAGOGASTRODUODENOSCOPY  06/27/15   Dr. Karlyn Agee: gastritis  . HAND SURGERY Right    trigger finger  . HERNIA REPAIR     umbilical hernia  . KNEE SURGERY Right    arthroscopy x4  . LYMPH NODE DISSECTION     2 removed in left neck, "bonded together"        Home Medications    Prior to Admission medications   Medication Sig Start Date End Date Taking? Authorizing Provider  chlorthalidone (HYGROTON) 25 MG tablet Take 25 mg by mouth every evening.  10/18/17  Yes [provider]  diltiazem (TIAZAC) 360 MG 24 hr capsule Take 360 mg by mouth every evening.  10/18/17  Yes [provider]  divalproex (DEPAKOTE ER) 500 MG 24 hr tablet Take 1,000 mg by mouth at bedtime.   Yes [provider]  omeprazole (PRILOSEC) 20 MG capsule Take 20 mg by mouth every evening.    Yes [provider]  propranolol ER (INDERAL LA) 120 MG 24 hr capsule Take 1 capsule (120 mg total) by mouth daily. Patient taking differently: Take 120 mg by mouth at bedtime.  10/13/17  Yes Melvenia Beam, MD  risperiDONE (RISPERDAL) 2 MG tablet Take 4 mg by mouth at bedtime.    Yes [provider]  sertraline (ZOLOFT) 100 MG tablet Take 200 mg by mouth at bedtime.    Yes [provider]  tamsulosin (FLOMAX) 0.4 MG CAPS capsule Take 0.4 mg by mouth at bedtime.  10/24/17  Yes [provider]  cephALEXin (KEFLEX) 500 MG capsule Take 500 mg by mouth 2 (two) times daily. 10/24/17   [provider]  oxyCODONE-acetaminophen (PERCOCET/ROXICET) 5-325 MG tablet Take 1 tablet by mouth every 4 (four) hours as needed for severe pain. 11/21/17   Evalee Jefferson, PA-C  oxyCODONE-acetaminophen (PERCOCET/ROXICET) 5-325 MG tablet Take 1 tablet by mouth every 4 (four) hours as needed. 11/21/17   Evalee Jefferson, PA-C    Family History Family History  Problem Relation Age of Onset  . Irritable bowel syndrome Sister   . Colitis Maternal Aunt        had colostomy  . Irritable bowel syndrome Paternal Grandmother   . Crohn's disease Neg Hx   . Colon cancer Neg Hx   . Celiac disease Neg Hx   . Migraines Neg Hx     Social History Social History   Tobacco Use  . Smoking status: Never Smoker  . Smokeless tobacco: Current User    Types: Snuff  Substance Use Topics  . Alcohol use: Yes    Alcohol/week: 0.0 oz    Comment: rare  . Drug use: No     Allergies   Toradol [ketorolac tromethamine]   Review of Systems Review of Systems  Constitutional: Negative for chills and fever.  HENT: Negative.   Eyes: Negative.   Respiratory: Negative for chest tightness and shortness of breath.   Cardiovascular: Negative for chest pain.  Gastrointestinal: Positive for abdominal pain and nausea. Negative for constipation, diarrhea and vomiting.  Genitourinary: Negative.  Negative for dysuria and hematuria.  Musculoskeletal: Negative for arthralgias, joint swelling and neck pain.  Skin: Negative.  Negative for rash and wound.  Neurological: Negative for dizziness, weakness, light-headedness, numbness and headaches.  Psychiatric/Behavioral: Negative.      Physical Exam Updated Vital Signs BP 138/63   Pulse 73   Temp 98 F (36.7 C) (Oral)   Resp 20   Ht 6\' 4"  (1.93 m)   Wt (!) 156.5 kg (345 lb)   SpO2 100%   BMI 41.99 kg/m   Physical Exam  Constitutional: He appears well-developed and well-nourished.  HENT:  Head: Normocephalic and atraumatic.  Eyes: Conjunctivae are normal.  Neck: Normal range of motion.  Cardiovascular: Normal rate, regular rhythm, normal heart sounds and intact distal pulses.  Pulmonary/Chest: Effort normal and breath  sounds normal. He has no wheezes.  Abdominal: Soft. Bowel sounds are normal. He exhibits no mass. There is tenderness in the right lower quadrant. There is no rigidity, no rebound, no guarding and no CVA tenderness. No hernia.  Musculoskeletal: Normal range of motion.  Neurological: He is alert.  Skin: Skin is warm and dry.  Psychiatric: He has a normal mood and affect.  Nursing note and vitals reviewed.    ED Treatments / Results  Labs (all labs ordered are listed, but only abnormal results are displayed) Labs Reviewed  BASIC METABOLIC PANEL - Abnormal; Notable for the following components:      Result Value   Sodium 133 (*)    Potassium 3.4 (*)    Chloride 97 (*)    Glucose,  Bld 184 (*)    Calcium 8.7 (*)    All other components within normal limits  URINALYSIS, ROUTINE W REFLEX MICROSCOPIC - Abnormal; Notable for the following components:   APPearance HAZY (*)    Glucose, UA 50 (*)    Hgb urine dipstick MODERATE (*)    Bacteria, UA RARE (*)    All other components within normal limits  CBC WITH DIFFERENTIAL/PLATELET    EKG None  Radiology Ct Renal Stone Study  Result Date: 11/21/2017 CLINICAL DATA:  Acute onset of right flank pain and nausea. EXAM: CT ABDOMEN AND PELVIS WITHOUT CONTRAST TECHNIQUE: Multidetector CT imaging of the abdomen and pelvis was performed following the standard protocol without IV contrast. COMPARISON:  CT of the abdomen and pelvis performed 10/24/2017 FINDINGS: Lower chest: Minimal bibasilar atelectasis is noted. The visualized portions of the mediastinum are unremarkable. Hepatobiliary: The liver is unremarkable in appearance. The patient is status post cholecystectomy, with clips noted at the gallbladder fossa. The common bile duct remains normal in caliber. Pancreas: The pancreas is within normal limits. Spleen: The spleen is unremarkable in appearance. Adrenals/Urinary Tract: The adrenal glands are unremarkable in appearance. Mild nonspecific  perinephric stranding is noted bilaterally. A nonobstructing 8 mm stone is noted at the lower pole of the right kidney. A small left renal cyst is seen. There is no evidence of hydronephrosis. No obstructing ureteral stones are seen. Stomach/Bowel: The stomach is unremarkable in appearance. The small bowel is within normal limits. The appendix is normal in caliber, without evidence of appendicitis. The colon is unremarkable in appearance. Vascular/Lymphatic: The abdominal aorta is unremarkable in appearance. The inferior vena cava is grossly unremarkable. No retroperitoneal lymphadenopathy is seen. No pelvic sidewall lymphadenopathy is identified. Reproductive: The bladder is mildly distended and grossly unremarkable. The prostate is normal in size. Other: A small anterior abdominal wall hernia is noted at the site of prior surgery, about the umbilicus, without evidence for herniation of bowel. Musculoskeletal: No acute osseous abnormalities are identified. The visualized musculature is unremarkable in appearance. IMPRESSION: 1. No acute abnormality seen to explain the patient's symptoms. 2. Nonobstructing 8 mm stone at the lower pole of the right kidney. Small left renal cyst seen. 3. Small anterior abdominal wall hernia at the site of prior surgery, about the umbilicus, without evidence for herniation of bowel. Electronically Signed   By: Garald Balding M.D.   On: 11/21/2017 22:08    Procedures Procedures (including critical care time)  Medications Ordered in ED Medications  HYDROmorphone (DILAUDID) injection 1 mg (1 mg Intravenous Given 11/21/17 1952)  ondansetron (ZOFRAN) injection 4 mg (4 mg Intravenous Given 11/21/17 1952)  HYDROmorphone (DILAUDID) injection 0.5 mg (0.5 mg Intravenous Given 11/21/17 2048)  tamsulosin (FLOMAX) capsule 0.4 mg (0.4 mg Oral Given 11/21/17 2141)  fentaNYL (SUBLIMAZE) injection 50 mcg (50 mcg Intravenous Given 11/21/17 2144)     Initial Impression / Assessment and Plan / ED  Course  I have reviewed the triage vital signs and the nursing notes.  Pertinent labs & imaging results that were available during my care of the patient were reviewed by me and considered in my medical decision making (see chart for details).     Pt with suspected right ureteral stone, most likely the 98mm stone seen on his recent Ct imaging at [facility].  Given dilaudid 1 mg without improvement in pain, repeated x 1 at 0.5 mg with pain at 7/10.  No n/v.  Pt cannot tolerate toradol.  Flomax, fentanyl added.  Will image again given no response to pain control.  Labs normal except for hematuria which would be expected with ureteral stone.  CT imaging negative for ureteral stone, but with hematuria, suspect possible punctate stone passage as the larger stone (51mm measurement by todays imaging) still in the renal pelvis.  No hydronephrosis. No complications from recent ureteral procedure noted. No appendicitis or colitis. No intestinal obstruction.  There is an incisional umbilical hernia, no pain or palpable hernia on re-exam.  Doubt this as source of sx. Pt was given a urine strainer, oxycodone prescribed.  He will contact his urologist in the morning for further evaluation of his sx.   Final Clinical Impressions(s) / ED Diagnoses   Final diagnoses:  Right lower quadrant abdominal pain  Hematuria, unspecified type  Incisional hernia of anterior abdominal wall without obstruction or gangrene    ED Discharge Orders        Ordered    oxyCODONE-acetaminophen (PERCOCET/ROXICET) 5-325 MG tablet  Every 4 hours PRN     11/21/17 2333    oxyCODONE-acetaminophen (PERCOCET/ROXICET) 5-325 MG tablet  Every 4 hours PRN     11/21/17 2334       Evalee Jefferson, PA-C 11/22/17 1231    Hayden Rasmussen, MD 11/22/17 1550

## 2017-11-21 NOTE — ED Triage Notes (Signed)
Patient c/o right flank pain that started approx 45 minutes ago. Per patient nausea. Denies any vomiting, diarrhea, fevers, or urinary symptoms. Patient has hx of kidney stones in which he was recently treated for 3 and had surgery to remove them.

## 2017-11-21 NOTE — ED Notes (Signed)
Provided patient with urine strainer.

## 2017-11-21 NOTE — Discharge Instructions (Signed)
As discussed,  your symptoms along with the blood in your urine certainly suggests you are passing a kidney stone, although there the large noted stone is still in your kidney.

## 2017-11-22 MED FILL — Oxycodone w/ Acetaminophen Tab 5-325 MG: ORAL | Qty: 6 | Status: AC

## 2017-12-27 ENCOUNTER — Emergency Department (HOSPITAL_COMMUNITY)
Admission: EM | Admit: 2017-12-27 | Discharge: 2017-12-27 | Disposition: A | Discharge status: Home / Self Care | Payer: PRIVATE HEALTH INSURANCE | Attending: Emergency Medicine | Admitting: Emergency Medicine

## 2017-12-27 ENCOUNTER — Emergency Department (HOSPITAL_COMMUNITY): Payer: PRIVATE HEALTH INSURANCE

## 2017-12-27 ENCOUNTER — Other Ambulatory Visit: Payer: Self-pay

## 2017-12-27 ENCOUNTER — Encounter (HOSPITAL_COMMUNITY): Payer: Self-pay | Admitting: Emergency Medicine

## 2017-12-27 DIAGNOSIS — R0789 Other chest pain: Secondary | ICD-10-CM | POA: Diagnosis not present

## 2017-12-27 DIAGNOSIS — F17290 Nicotine dependence, other tobacco product, uncomplicated: Secondary | ICD-10-CM | POA: Insufficient documentation

## 2017-12-27 DIAGNOSIS — I10 Essential (primary) hypertension: Secondary | ICD-10-CM | POA: Insufficient documentation

## 2017-12-27 DIAGNOSIS — Z79899 Other long term (current) drug therapy: Secondary | ICD-10-CM | POA: Diagnosis not present

## 2017-12-27 DIAGNOSIS — R079 Chest pain, unspecified: Secondary | ICD-10-CM | POA: Diagnosis present

## 2017-12-27 LAB — BASIC METABOLIC PANEL
Anion gap: 11 (ref 5–15)
BUN: 12 mg/dL (ref 6–20)
CALCIUM: 8.9 mg/dL (ref 8.9–10.3)
CHLORIDE: 95 mmol/L — AB (ref 98–111)
CO2: 28 mmol/L (ref 22–32)
CREATININE: 0.74 mg/dL (ref 0.61–1.24)
Glucose, Bld: 114 mg/dL — ABNORMAL HIGH (ref 70–99)
Potassium: 3.9 mmol/L (ref 3.5–5.1)
Sodium: 134 mmol/L — ABNORMAL LOW (ref 135–145)

## 2017-12-27 LAB — HEPATIC FUNCTION PANEL
ALK PHOS: 62 U/L (ref 38–126)
ALT: 21 U/L (ref 0–44)
AST: 25 U/L (ref 15–41)
Albumin: 3.9 g/dL (ref 3.5–5.0)
BILIRUBIN DIRECT: 0.1 mg/dL (ref 0.0–0.2)
BILIRUBIN INDIRECT: 0.7 mg/dL (ref 0.3–0.9)
BILIRUBIN TOTAL: 0.8 mg/dL (ref 0.3–1.2)
Total Protein: 7.6 g/dL (ref 6.5–8.1)

## 2017-12-27 LAB — CBC
HCT: 41.5 % (ref 39.0–52.0)
Hemoglobin: 14.3 g/dL (ref 13.0–17.0)
MCH: 31 pg (ref 26.0–34.0)
MCHC: 34.5 g/dL (ref 30.0–36.0)
MCV: 90 fL (ref 78.0–100.0)
Platelets: 149 10*3/uL — ABNORMAL LOW (ref 150–400)
RBC: 4.61 MIL/uL (ref 4.22–5.81)
RDW: 12.8 % (ref 11.5–15.5)
WBC: 8.9 10*3/uL (ref 4.0–10.5)

## 2017-12-27 LAB — TROPONIN I: Troponin I: <0.03 ng/mL

## 2017-12-27 LAB — D-DIMER, QUANTITATIVE: D-Dimer, Quant: <0.27 ug{FEU}/mL (ref 0.00–0.50)

## 2017-12-27 MED ORDER — HYDROCODONE-ACETAMINOPHEN 5-325 MG PO TABS
1.0000 | ORAL_TABLET | Freq: Once | ORAL | Status: AC
  Administered 2017-12-27: 1 via ORAL
  Filled 2017-12-27: qty 1

## 2017-12-27 MED ORDER — HYDROCODONE-ACETAMINOPHEN 5-325 MG PO TABS: 1.0000 | ORAL_TABLET | Freq: Four times a day (QID) | ORAL | 0 refills | Status: DC | PRN

## 2017-12-27 NOTE — ED Triage Notes (Signed)
Pt c/o of left sided chest pain that radiates to left arm and neck.

## 2017-12-27 NOTE — Discharge Instructions (Addendum)
Follow-up with your family doctor in 1 to 2 weeks for recheck 

## 2017-12-27 NOTE — ED Provider Notes (Signed)
[facility] EMERGENCY DEPARTMENT Provider Note   CSN: [id number] Arrival date & time: 12/27/17  1127     History   Chief Complaint Chief Complaint  Patient presents with  . Chest Pain    HPI Matthew Vance is a 37 y.o. male.  Patient complains of left-sided chest discomfort.  The history is provided by the patient. No language interpreter was used.  Chest Pain   This is a new problem. The current episode started 3 to 5 hours ago. The problem occurs constantly. The problem has not changed since onset.The pain is associated with movement. The pain is present in the lateral region. The pain is at a severity of 5/10. The pain is moderate. The quality of the pain is described as sharp. Radiates to: Left arm. The symptoms are aggravated by certain positions. Pertinent negatives include no abdominal pain, no back pain, no cough and no headaches.  Pertinent negatives for past medical history include no seizures.    Past Medical History:  Diagnosis Date  . Anxiety   . Depression   . GERD (gastroesophageal reflux disease)   . Hypertension   . Migraine   . Pre-diabetes   . Renal disorder    kidney stone    Patient Active Problem List   Diagnosis Date Noted  . Migraine with aura and without status migrainosus 10/13/2017  . 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 RLQ abdominal pain 10/11/2015  . History of colonic polyps   . Abnormal computed tomography of cecum and terminal ileum   . Abnormal weight loss 09/11/2015  . Rectal bleeding 09/11/2015  . Abdominal pain 06/22/2013  . Hyponatremia 06/22/2013  . Hyperglycemia 06/22/2013  . Nausea with vomiting 06/22/2013  . Hematuria, microscopic 06/22/2013  . Diarrhea 06/22/2013    Past Surgical History:  Procedure Laterality Date  . CHOLECYSTECTOMY  06/14/15   Dr. Karlyn Agee: path: cholelithiasis with chronic cholecystitis  . COLONOSCOPY  06/04/15   Dr. Doristine Mango: normal  . COLONOSCOPY WITH PROPOFOL N/A 09/26/2015   next  tcs in 5 years for tubular adenoma. random colon bx negative. subtle muscosal changes of TI with negative bx, int hemorrhoids.   . CYSTOSCOPY WITH RETROGRADE PYELOGRAM, URETEROSCOPY AND STENT PLACEMENT Right 11/03/2017   Procedure: CYSTOSCOPY WITH RIGHT RETROGRADE PYELOGRAM, URETEROSCOPY AND STONE EXTRACTION;  Surgeon: Cleon Gustin, MD;  Location: [facility];  Service: Urology;  Laterality: Right;  . ESOPHAGOGASTRODUODENOSCOPY  06/27/15   Dr. Karlyn Agee: gastritis  . HAND SURGERY Right    trigger finger  . HERNIA REPAIR     umbilical hernia  . KNEE SURGERY Right    arthroscopy x4  . LYMPH NODE DISSECTION     2 removed in left neck, "bonded together"        Home Medications    Prior to Admission medications   Medication Sig Start Date End Date Taking? Authorizing Provider  cephALEXin (KEFLEX) 500 MG capsule Take 500 mg by mouth 2 (two) times daily. 10/24/17   [provider]  chlorthalidone (HYGROTON) 25 MG tablet Take 25 mg by mouth every evening.  10/18/17   [provider]  diltiazem (TIAZAC) 360 MG 24 hr capsule Take 360 mg by mouth every evening.  10/18/17   [provider]  divalproex (DEPAKOTE ER) 500 MG 24 hr tablet Take 1,000 mg by mouth at bedtime.    [provider]  HYDROcodone-acetaminophen (NORCO/VICODIN) 5-325 MG tablet Take 1 tablet by mouth every 6 (six) hours as needed for moderate pain. 12/27/17  Milton Ferguson, MD  omeprazole (PRILOSEC) 20 MG capsule Take 20 mg by mouth every evening.     [provider]  oxyCODONE-acetaminophen (PERCOCET/ROXICET) 5-325 MG tablet Take 1 tablet by mouth every 4 (four) hours as needed for severe pain. 11/21/17   Evalee Jefferson, PA-C  oxyCODONE-acetaminophen (PERCOCET/ROXICET) 5-325 MG tablet Take 1 tablet by mouth every 4 (four) hours as needed. 11/21/17   Evalee Jefferson, PA-C  propranolol ER (INDERAL LA) 120 MG 24 hr capsule Take 1 capsule (120 mg total) by mouth daily. Patient taking differently: Take  120 mg by mouth at bedtime.  10/13/17   Melvenia Beam, MD  risperiDONE (RISPERDAL) 2 MG tablet Take 4 mg by mouth at bedtime.     [provider]  sertraline (ZOLOFT) 100 MG tablet Take 200 mg by mouth at bedtime.     [provider]  tamsulosin (FLOMAX) 0.4 MG CAPS capsule Take 0.4 mg by mouth at bedtime.  10/24/17   [provider]    Family History Family History  Problem Relation Age of Onset  . Irritable bowel syndrome Sister   . Colitis Maternal Aunt        had colostomy  . Irritable bowel syndrome Paternal Grandmother   . Crohn's disease Neg Hx   . Colon cancer Neg Hx   . Celiac disease Neg Hx   . Migraines Neg Hx     Social History Social History   Tobacco Use  . Smoking status: Never Smoker  . Smokeless tobacco: Current User    Types: Snuff  Substance Use Topics  . Alcohol use: Yes    Alcohol/week: 0.0 standard drinks    Comment: rare  . Drug use: No     Allergies   Toradol [ketorolac tromethamine]   Review of Systems Review of Systems  Constitutional: Negative for appetite change and fatigue.  HENT: Negative for congestion, ear discharge and sinus pressure.   Eyes: Negative for discharge.  Respiratory: Negative for cough.   Cardiovascular: Positive for chest pain.  Gastrointestinal: Negative for abdominal pain and diarrhea.  Genitourinary: Negative for frequency and hematuria.  Musculoskeletal: Negative for back pain.  Skin: Negative for rash.  Neurological: Negative for seizures and headaches.  Psychiatric/Behavioral: Negative for hallucinations.     Physical Exam Updated Vital Signs BP 119/69   Pulse 71   Temp 98.2 F (36.8 C) (Tympanic)   Resp 15   Ht 6\' 4"  (1.93 m)   Wt (!) 158.8 kg   SpO2 96%   BMI 42.60 kg/m   Physical Exam  Constitutional: He is oriented to person, place, and time. He appears well-developed.  HENT:  Head: Normocephalic.  Eyes: Conjunctivae and EOM are normal. No scleral icterus.  Neck:  Neck supple. No thyromegaly present.  Cardiovascular: Normal rate and regular rhythm. Exam reveals no gallop and no friction rub.  No murmur heard. Pulmonary/Chest: No stridor. He has no wheezes. He has no rales. He exhibits no tenderness.  Abdominal: He exhibits no distension. There is no tenderness. There is no rebound.  Musculoskeletal: Normal range of motion. He exhibits no edema.  Tender left chest  Lymphadenopathy:    He has no cervical adenopathy.  Neurological: He is oriented to person, place, and time. He exhibits normal muscle tone. Coordination normal.  Skin: No rash noted. No erythema.  Psychiatric: He has a normal mood and affect. His behavior is normal.     ED Treatments / Results  Labs (all labs ordered are listed, but  only abnormal results are displayed) Labs Reviewed  BASIC METABOLIC PANEL - Abnormal; Notable for the following components:      Result Value   Sodium 134 (*)    Chloride 95 (*)    Glucose, Bld 114 (*)    All other components within normal limits  CBC - Abnormal; Notable for the following components:   Platelets 149 (*)    All other components within normal limits  TROPONIN I  TROPONIN I  D-DIMER, QUANTITATIVE (NOT AT [facility])  HEPATIC FUNCTION PANEL    EKG EKG Interpretation  Date/Time:  Monday December 27 2017 11:34:57 EDT Ventricular Rate:  72 PR Interval:  180 QRS Duration: 102 QT Interval:  398 QTC Calculation: 435 R Axis:   67 Text Interpretation:  Normal sinus rhythm Normal ECG Confirmed by Milton Ferguson [phone]) on 12/27/2017 3:39:14 PM   Radiology Dg Chest 2 View  Result Date: 12/27/2017 CLINICAL DATA:  Left-sided chest pain radiating into the left arm and neck. EXAM: CHEST - 2 VIEW COMPARISON:  Chest x-ray of September 28, 2017. FINDINGS: The lungs are adequately inflated. There is no focal infiltrate. There is no pleural effusion. The cardiac silhouette is top-normal in size and stable. The pulmonary vascularity is normal. The  mediastinum is normal in width. The bony thorax is unremarkable. IMPRESSION: There is no pneumonia, CHF, nor other acute cardiopulmonary abnormality. Electronically Signed   By: David  Martinique M.D.   On: 12/27/2017 11:59    Procedures Procedures (including critical care time)  Medications Ordered in ED Medications  HYDROcodone-acetaminophen (NORCO/VICODIN) 5-325 MG per tablet 1 tablet (1 tablet Oral Given 12/27/17 1553)     Initial Impression / Assessment and Plan / ED Course  I have reviewed the triage vital signs and the nursing notes.  Pertinent labs & imaging results that were available during my care of the patient were reviewed by me and considered in my medical decision making (see chart for details).     Opponent x2 is normal.  EKG unremarkable.  Chest x-ray normal.  Suspect chest wall discomfort.  Patient will follow-up with his PCP and is given some hydrocodone for discomfort  Final Clinical Impressions(s) / ED Diagnoses   Final diagnoses:  Atypical chest pain    ED Discharge Orders         Ordered    HYDROcodone-acetaminophen (NORCO/VICODIN) 5-325 MG tablet  Every 6 hours PRN     12/27/17 Barth Kirks, MD 12/27/17 1803

## 2018-02-14 ENCOUNTER — Ambulatory Visit: Payer: PRIVATE HEALTH INSURANCE | Admitting: Neurology

## 2018-03-30 ENCOUNTER — Ambulatory Visit: Payer: PRIVATE HEALTH INSURANCE | Admitting: Neurology

## 2018-03-30 ENCOUNTER — Telehealth: Payer: Self-pay | Admitting: *Deleted

## 2018-03-30 NOTE — Telephone Encounter (Signed)
Pt no showed f/u appt on 03/30/2018 @ 08:30.

## 2018-03-31 ENCOUNTER — Encounter: Payer: Self-pay | Admitting: Neurology

## 2018-05-12 ENCOUNTER — Other Ambulatory Visit: Payer: Self-pay | Admitting: Neurology

## 2018-07-16 ENCOUNTER — Other Ambulatory Visit: Payer: Self-pay | Admitting: Neurology

## 2018-09-06 ENCOUNTER — Ambulatory Visit (INDEPENDENT_AMBULATORY_CARE_PROVIDER_SITE_OTHER): Payer: No Typology Code available for payment source | Admitting: Gastroenterology

## 2018-09-06 ENCOUNTER — Other Ambulatory Visit: Payer: Self-pay

## 2018-09-06 ENCOUNTER — Encounter: Payer: Self-pay | Admitting: Gastroenterology

## 2018-09-06 DIAGNOSIS — R197 Diarrhea, unspecified: Secondary | ICD-10-CM

## 2018-09-06 DIAGNOSIS — K921 Melena: Secondary | ICD-10-CM

## 2018-09-06 DIAGNOSIS — R112 Nausea with vomiting, unspecified: Secondary | ICD-10-CM

## 2018-09-06 DIAGNOSIS — R1031 Right lower quadrant pain: Secondary | ICD-10-CM

## 2018-09-06 DIAGNOSIS — R634 Abnormal weight loss: Secondary | ICD-10-CM

## 2018-09-06 DIAGNOSIS — K760 Fatty (change of) liver, not elsewhere classified: Secondary | ICD-10-CM

## 2018-09-06 NOTE — Progress Notes (Signed)
Primary Care Physician:  Caryl Bis, MD Primary GI:  Garfield Cornea, MD   Patient Location: Home  Provider Location: [facility] office  Reason for Visit: Melena, right lower quadrant pain, hepatic steatosis, weight loss  Persons present on the virtual encounter, with roles: Patient, myself (provider), Charma Igo, CMA (updated meds and allergies)  Total time (minutes) spent on medical discussion: 30 minutes  Due to COVID-19, visit was conducted using [email or web address] method.  Visit was requested by patient.  Virtual Visit via [email or web address]  I connected with Matthew Vance on 09/07/18 at 10:00 AM EDT by [email or web address] and verified that I am speaking with the correct person using two identifiers.   I discussed the limitations, risks, security and privacy concerns of performing an evaluation and management service by telephone/video and the availability of in person appointments. I also discussed with the patient that there may be a patient responsible charge related to this service. The patient expressed understanding and agreed to proceed.  Chief Complaint  Patient presents with   Melena    bleeding with stool as well, not straining, BM's up to 4 times a day   Abdominal Pain    RLQ, constant x 3 months getting worse   Nausea    w/ vomiting-not a lot per pt   Weight Loss    lost 37 lbs in 3 months    HPI:   Matthew Vance is a 38 y.o. male who presents for virtual visit regarding melena, hepatic steatosis, abdominal pain, weight loss at the request of Matthew Hacker, PA-C at [place].  Patient last seen in our office in June 2017.  We have been seeing him for episodes of epigastric pain radiating into the right lower quadrant associated with vomiting and syncope.  Also associated with 7-8 loose stools daily.  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  Before we had met him he had had extensive evaluation by PCP, results as outlined below.  No relief  on Bentyl for diarrhea. In June 2017 he had a colonoscopy showing 5 mm polyp removed from the hepatic flexure, tubular adenoma.  Segmental biopsies from the colon were unremarkable.  Abnormal mucosa in the terminal ileum of uncertain significance, quite subtle, biopsies unremarkable.  Internal hemorrhoids.  Ultimately he was sent to see Dr. Roney Mans at [facility] for second opinion.  More recently patient states about 3 months ago his symptoms returned.  Similar to they were in the past.  Currently symptoms are mostly right mid to right lower quadrant.  He has a constant pain but at other times the dull pain turns quite severe.  Completely unrelated to meals.  When he has severe pain he has to lay down.  He has nausea frequently with intermittent vomiting.  Having 4-5 loose stools daily.  He states his stools are dark to black and have been that way for 3 months.  At times he has fresh blood per rectum as well.  He denies heartburn.  His abdominal pain is around the belt line in the right abdomen.  Seems to be aggravated by lifting and movement.  He reports a 37 pound weight loss.  He states that current weight is around 350 pounds.  He reports weighing close to 400 pounds several months back.  Last documented weight available to me was 350 pounds in September 2019.  He had labs in January 2020, glucose 157,  BUN 11, creatinine 0.76, total bilirubin less than 0.2, alkaline phosphatase 80, AST 13, ALT 17, albumin 4.4, white blood cell count 9800, hemoglobin 15, MCV 91, platelets 222,000, lipase 32.  CT abdomen and pelvis with contrast 04/2018 [facility].  Severe steatosis.  Although appendix is not directly visualized, no inflammatory process seen in the region of the cecum or elsewhere.  Stable small periumbilical ventral hernia containing a loop of small bowel but no evidence of strangulation or obstruction.   PERTINENT PREVIOUS WORK UP EGD by Dr. Tye Maryland in March 2017, gastritis.     Colonoscopy by Dr. Britta Mccreedy in February 2017, normal.  CT abdomen and pelvis without contrast at [facility] on 09/03/2015 Impression: Small nonobstructing right renal calculi. No hydronephrosis. Small fat-containing umbilical hernia.  MRI/MRCP at [facility] on 06/28/2015 Impression: Post cholecystectomy. Normal liver and biliary tree. Normal pancreas on noncontrast exam.  CT of the abdomen and pelvis with contrast at [facility] on 06/26/2015 Impression: Stable or slightly improved slight stranding in the cholecystectomy bed, likely related to recent postoperative state. No measurable fluid collection.  CT of the abdomen and pelvis with contrast at [facility] on 06/17/2015 Impression: Post cholecystectomy. There is a small amount of fluid and stranding around the cholecystectomy bed in the hepatic flexure. As likely represents expected postoperative changes. Nonobstructing right kidney stones.  Small bowel follow-through at [facility] on 06/06/2015 Impression normal small bowel follow-through study.  CT abdomen and pelvis without contrast at [facility] on 05/21/2015 Impression: Bilateral nonobstructing nephrolithiasis. No hydronephrosis. Small fat-containing umbilical hernia.  We sent him to [facility] for second opinion regarding abdominal pain, he was seen August 2017 by Dr. Roney Mans.  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  Had noted improvement of symptoms with reduction of caffeine.  Advised to follow low FODMAP diet, use Levsin sublingually as needed.  There was plans for consideration of capsule endoscopy to evaluate small bowel if ongoing symptoms. Patient did not return for follow up.   Extensive lab evaluation 2017: Celiac serologies negative.  24-hour urine porphobilinogen quantitative within normal range.  Random urine  fractionated porphyrins unremarkable.    Current Outpatient Medications  Medication Sig Dispense Refill   chlorthalidone (HYGROTON) 25 MG tablet Take 25 mg by mouth every evening.   1   diltiazem (TIAZAC) 360 MG 24 hr capsule Take 360 mg by mouth every evening.   1   metFORMIN (GLUCOPHAGE-XR) 500 MG 24 hr tablet Take 500 mg by mouth 2 (two) times daily.     omeprazole (PRILOSEC) 20 MG capsule Take 20 mg by mouth every evening.      propranolol ER (INDERAL LA) 120 MG 24 hr capsule Take 1 capsule (120 mg total) by mouth at bedtime. CALL [phone] TO SCHEDULE APPOINTMENT. 30 capsule 2   sertraline (ZOLOFT) 100 MG tablet Take 200 mg by mouth at bedtime.      TRULICITY 8.33 AS/5.0NL SOPN Inject 0.75 mg as directed once a week.     No current facility-administered medications for this visit.     Past Medical History:  Diagnosis Date   Anxiety    Depression    GERD (gastroesophageal reflux disease)    Hypertension    Migraine    Pre-diabetes    Renal disorder    kidney stone    Past Surgical History:  Procedure Laterality Date   CHOLECYSTECTOMY  06/14/15   Dr. Karlyn Agee: path:  cholelithiasis with chronic cholecystitis   COLONOSCOPY  06/04/15   Dr. Doristine Mango: normal   COLONOSCOPY WITH PROPOFOL N/A 09/26/2015   next tcs in 5 years for tubular adenoma. random colon bx negative. subtle muscosal changes of TI with negative bx, int hemorrhoids.    CYSTOSCOPY WITH RETROGRADE PYELOGRAM, URETEROSCOPY AND STENT PLACEMENT Right 11/03/2017   Procedure: CYSTOSCOPY WITH RIGHT RETROGRADE PYELOGRAM, URETEROSCOPY AND STONE EXTRACTION;  Surgeon: Cleon Gustin, MD;  Location: [facility];  Service: Urology;  Laterality: Right;   ESOPHAGOGASTRODUODENOSCOPY  06/27/15   Dr. Karlyn Agee: gastritis   HAND SURGERY Right    trigger finger   HERNIA REPAIR     umbilical hernia   KNEE SURGERY Right    arthroscopy x4   LYMPH NODE DISSECTION  2019   2 removed in left  neck, "bonded together"   partial parathyroidectomy with lymph node dissection  2019   benign    Family History  Problem Relation Age of Onset   Irritable bowel syndrome Sister    Colitis Maternal Aunt        had colostomy   Irritable bowel syndrome Paternal Grandmother    Crohn's disease Neg Hx    Colon cancer Neg Hx    Celiac disease Neg Hx    Migraines Neg Hx     Social History   Socioeconomic History   Marital status: Single    Spouse name: Not on file   Number of children: 2   Years of education: some college   Highest education level: Not on file  Occupational History   Occupation: [employer]  Social Needs   Emergency planning/management officer strain: Not on file   Food insecurity:    Worry: Not on file    Inability: Not on file   Transportation needs:    Medical: Not on file    Non-medical: Not on file  Tobacco Use   Smoking status: Never Smoker   Smokeless tobacco: Current User    Types: Snuff  Substance and Sexual Activity   Alcohol use: Yes    Alcohol/week: 0.0 standard drinks    Comment: rare   Drug use: No   Sexual activity: Yes    Birth control/protection: None  Lifestyle   Physical activity:    Days per week: Not on file    Minutes per session: Not on file   Stress: Not on file  Relationships   Social connections:    Talks on phone: Not on file    Gets together: Not on file    Attends religious service: Not on file    Active member of club or organization: Not on file    Attends meetings of clubs or organizations: Not on file    Relationship status: Not on file   Intimate partner violence:    Fear of current or ex partner: Not on file    Emotionally abused: Not on file    Physically abused: Not on file    Forced sexual activity: Not on file  Other Topics Concern   Not on file  Social History Narrative   Lives with children   Caffeine- 1 20 oz daily      ROS:  General: Negative for anorexia,   fever, chills, fatigue,  weakness.  See HPI Eyes: Negative for vision changes.  ENT: Negative for hoarseness, difficulty swallowing , nasal congestion. CV: Negative for chest pain, angina, palpitations, dyspnea on exertion, peripheral edema.  Respiratory: Negative for dyspnea at rest, dyspnea on  exertion, cough, sputum, wheezing.  GI: See history of present illness. GU:  Negative for dysuria, hematuria, urinary incontinence, urinary frequency, nocturnal urination.  MS: Negative for joint pain, low back pain.  Derm: Negative for rash or itching.  Neuro: Negative for weakness, abnormal sensation, seizure, frequent headaches, memory loss, confusion.  Psych: Negative for anxiety, depression, suicidal ideation, hallucinations.  Endo: Negative for unusual weight change.  Heme: Negative for bruising or bleeding. Allergy: Negative for rash or hives.   Observations/Objective: Pleasant obese Caucasian male in no acute distress.  See HPI for lab results  Assessment and Plan: Pleasant 38 year old gentleman presenting with recurrent right-sided abdominal pain associated with diarrhea, nausea with intermittent vomiting, reported 37 pound weight loss.  CT with contrast in January showed hepatic steatosis and periumbilical hernia containing small bowel loop but no evidence of strangulation or obstruction.  Patient complains of constant right mid to right lower quadrant pain, at times severe requiring him to lay down.  Similar symptoms in 2017 with extensive evaluation as outlined above.  Second opinion obtained at [facility] by Dr. Roney Mans, suspected IBS-D.  Had planned on considering small bowel capsule endoscopy at follow-up with ongoing symptoms but patient never returned.  He had some subtle abnormalities of the terminal ileum at time of ileocolonoscopy by Dr. Gala Romney in 2017, biopsies were unremarkable.  Etiology of his symptoms are not clear.  Cannot exclude underlying IBS-D.  He has really not  responded to antispasmodics however.  He has tried Levsin and dicyclomine.  Wonder about possibility of evolving inflammatory bowel disease, he may benefit from repeat ileocolonoscopy versus capsule endoscopy although would recommend agile capsule initially due to history of hernia containing small bowel loop.  Will discuss plan with Dr. Gala Romney.    He has severe hepatic steatosis.  His LFTs have been normal.  He would benefit from weight reduction, tight glycemic control.  Follow Up Instructions:    I discussed the assessment and treatment plan with the patient. The patient was provided an opportunity to ask questions and all were answered. The patient agreed with the plan and demonstrated an understanding of the instructions. AVS mailed to patient's home address.   The patient was advised to call back or seek an in-person evaluation if the symptoms worsen or if the condition fails to improve as anticipated.  I provided 30 minutes of virtual face-to-face time during this encounter.   Neil Crouch, PA-C

## 2018-09-07 ENCOUNTER — Other Ambulatory Visit: Payer: Self-pay | Admitting: Neurology

## 2018-09-07 ENCOUNTER — Encounter: Payer: Self-pay | Admitting: Gastroenterology

## 2018-09-07 ENCOUNTER — Telehealth: Payer: Self-pay | Admitting: Gastroenterology

## 2018-09-07 DIAGNOSIS — K921 Melena: Secondary | ICD-10-CM

## 2018-09-07 DIAGNOSIS — K760 Fatty (change of) liver, not elsewhere classified: Secondary | ICD-10-CM | POA: Insufficient documentation

## 2018-09-07 NOTE — Patient Instructions (Addendum)
1. We need to update your labs given ongoing black stools and concern for blood in your stool.  I would like to recheck your hemoglobin to see if there is been any change since January.  We will also do labs to evaluate your abdominal pain.  2. Once we have results, we will be able to make better recommendations for next step in your work-up.  We will be discussing your case with Dr. Gala Romney in the interim. 3. On your CT scan, you did have significant fat within the liver.  That can be seen in diabetes, obesity, alcohol use, hereditary.  Increasing daily activity, maintaining good control of your diabetes, weight loss can help this.  Chronic inflammation liver can lead to cirrhosis in some people therefore it is important to try to make these changes.   Fatty Liver Disease  Fatty liver disease occurs when too much fat has built up in your liver cells. Fatty liver disease is also called hepatic steatosis or steatohepatitis. The liver removes harmful substances from your bloodstream and produces fluids that your body needs. It also helps your body use and store energy from the food you eat. In many cases, fatty liver disease does not cause symptoms or problems. It is often diagnosed when tests are being done for other reasons. However, over time, fatty liver can cause inflammation that may lead to more serious liver problems, such as scarring of the liver (cirrhosis) and liver failure. Fatty liver is associated with insulin resistance, increased body fat, high blood pressure (hypertension), and high cholesterol. These are features of metabolic syndrome and increase your risk for stroke, diabetes, and heart disease. What are the causes? This condition may be caused by:  Drinking too much alcohol.  Poor nutrition.  Obesity.  Cushing's syndrome.  Diabetes.  High cholesterol.  Certain drugs.  Poisons.  Some viral infections.  Pregnancy. What increases the risk? You are more likely to develop  this condition if you:  Abuse alcohol.  Are overweight.  Have diabetes.  Have hepatitis.  Have a high triglyceride level.  Are pregnant. What are the signs or symptoms? Fatty liver disease often does not cause symptoms. If symptoms do develop, they can include:  Fatigue.  Weakness.  Weight loss.  Confusion.  Abdominal pain.  Nausea and vomiting.  Yellowing of your skin and the white parts of your eyes (jaundice).  Itchy skin. How is this diagnosed? This condition may be diagnosed by:  A physical exam and medical history.  Blood tests.  Imaging tests, such as an ultrasound, CT scan, or MRI.  A liver biopsy. A small sample of liver tissue is removed using a needle. The sample is then looked at under a microscope. How is this treated? Fatty liver disease is often caused by other health conditions. Treatment for fatty liver may involve medicines and lifestyle changes to manage conditions such as:  Alcoholism.  High cholesterol.  Diabetes.  Being overweight or obese. Follow these instructions at home:   Do not drink alcohol. If you have trouble quitting, ask your health care provider how to safely quit with the help of medicine or a supervised program. This is important to keep your condition from getting worse.  Eat a healthy diet as told by your health care provider. Ask your health care provider about working with a diet and nutrition specialist (dietitian) to develop an eating plan.  Exercise regularly. This can help you lose weight and control your cholesterol and diabetes. Talk to your  health care provider about an exercise plan and which activities are best for you.  Take over-the-counter and prescription medicines only as told by your health care provider.  Keep all follow-up visits as told by your health care provider. This is important. Contact a health care provider if: You have trouble controlling your:  Blood sugar. This is especially  important if you have diabetes.  Cholesterol.  Drinking of alcohol. Get help right away if:  You have abdominal pain.  You have jaundice.  You have nausea and vomiting.  You vomit blood or material that looks like coffee grounds.  You have stools that are black, tar-like, or bloody. Summary  Fatty liver disease develops when too much fat builds up in the cells of your liver.  Fatty liver disease often causes no symptoms or problems. However, over time, fatty liver can cause inflammation that may lead to more serious liver problems, such as scarring of the liver (cirrhosis).  You are more likely to develop this condition if you abuse alcohol, are pregnant, are overweight, have diabetes, have hepatitis, or have high triglyceride levels.  Contact your health care provider if you have trouble controlling your weight, blood sugar, cholesterol, or drinking of alcohol. This information is not intended to replace advice given to you by your health care provider. Make sure you discuss any questions you have with your health care provider. Document Released: 05/22/2005 Document Revised: 01/13/2017 Document Reviewed: 01/13/2017 Elsevier Interactive Patient Education  2019 [employer].

## 2018-09-07 NOTE — Telephone Encounter (Signed)
    Please let patient know, I discussed his case with Dr. Gala Romney.   We need to update his labs given history of black stools, concern for blood in the stool.  Want to compare current hemoglobin with that done in January.  Dr. Gala Romney also recommends proceeding with capsule endoscopy as originally planned back in 2017 by Dr. Roney Mans at [facility] given history of chronic right lower quadrant pain, diarrhea, subtly abnormal terminal ileum on colonoscopy. BECAUSE HE HAS A HERNIA CONTAINING SMALL LOOP OF BOWEL WE NEED TO PURSUE AGILE TESTING FIRST.   If capsule study ends up being unremarkable, next step would be considering hernia repair with incidental appendectomy.

## 2018-09-08 NOTE — Telephone Encounter (Signed)
Pt notified of results. Please see [facility] recommendations for Agile Testing/ endoscopy.

## 2018-09-08 NOTE — Telephone Encounter (Signed)
LM for endo to see when agile can be scheduled

## 2018-09-08 NOTE — Progress Notes (Signed)
cc'ed to pcp °

## 2018-09-10 ENCOUNTER — Telehealth: Payer: Self-pay | Admitting: Gastroenterology

## 2018-09-10 NOTE — Telephone Encounter (Signed)
PT CALLED HAVING DIARRHEA FOR 3 YRS. BMs: 4-5/DAY. UP AT NIGHT WITH BM: ABOUT EVERY NIGHT. CHEESE; NOT A LOT, MILK: NOT MUCH, ICE CREAM: NONE. GB REMOVED: 2 YRS AGO. TRIED BENTYL AND NO HELP.   AVOID DAIRY. LOW FAT DIET FOR NEXT MO. CHEW TUMS ONE WITH MEALS 3 TIMES A DAY. IMODIUM TWICE A DAY. NO ASPIRIN, BC/GOODY POWDERS, IBUPROFEN/MOTRIN, OR NAPROXEN/ALEVE. RARE ETOH. BEEN ON METFORMIN FOR PAST 6 MOS. WILL PLACE LOW FAT DIET HANDOUT AND REC'S IN [facility] SO PT CAN ACCESS INFO.

## 2018-09-10 NOTE — Progress Notes (Signed)
TO REDUCE DIARRHEA:   1. AVOID DAIRY.   2. STRICTLY A LOW FAT DIET FOR THE NEXT MO. AVOID FRIED FOODS.   3. CHEW TUMS ONE WITH MEALS 3 TIMES A DAY.   4. USE IMODIUM TWICE A DAY.  Low-Fat Diet BREADS, CEREALS, PASTA, RICE, DRIED PEAS, AND BEANS These products are high in carbohydrates and most are low in fat. Therefore, they can be increased in the diet as substitutes for fatty foods. They too, however, contain calories and should not be eaten in excess. Cereals can be eaten for snacks as well as for breakfast.   FRUITS AND VEGETABLES It is good to eat fruits and vegetables. Besides being sources of fiber, both are rich in vitamins and some minerals. They help you get the daily allowances of these nutrients. Fruits and vegetables can be used for snacks and desserts.  MEATS Limit lean meat, chicken, [place], and fish to no more than 6 ounces per day. Beef, Pork, and Lamb Use lean cuts of beef, pork, and lamb. Lean cuts include:  Extra-lean ground beef.  Arm roast.  Sirloin tip.  Center-cut ham.  Round steak.  Loin chops.  Rump roast.  Tenderloin.  Trim all fat off the outside of meats before cooking. It is not necessary to severely decrease the intake of red meat, but lean choices should be made. Lean meat is rich in protein and contains a highly absorbable form of iron. Premenopausal women, in particular, should avoid reducing lean red meat because this could increase the risk for low red blood cells (iron-deficiency anemia).  Chicken and [place] These are good sources of protein. The fat of poultry can be reduced by removing the skin and underlying fat layers before cooking. Chicken and [place] can be substituted for lean red meat in the diet. Poultry should not be fried or covered with high-fat sauces. Fish and Shellfish Fish is a good source of protein. Shellfish contain cholesterol, but they usually are low in saturated fatty acids. The preparation of fish is important. Like chicken and  [place], they should not be fried or covered with high-fat sauces. EGGS Egg whites contain no fat or cholesterol. They can be eaten often. Try 1 to 2 egg whites instead of whole eggs in recipes or use egg substitutes that do not contain yolk. MILK AND DAIRY PRODUCTS Use skim or 1% milk instead of 2% or whole milk. Decrease whole milk, natural, and processed cheeses. Use nonfat or low-fat (2%) cottage cheese or low-fat cheeses made from vegetable oils. Choose nonfat or low-fat (1 to 2%) yogurt. Experiment with evaporated skim milk in recipes that call for heavy cream. Substitute low-fat yogurt or low-fat cottage cheese for sour cream in dips and salad dressings. Have at least 2 servings of low-fat dairy products, such as 2 glasses of skim (or 1%) milk each day to help get your daily calcium intake. FATS AND OILS Reduce the total intake of fats, especially saturated fat. Butterfat, lard, and beef fats are high in saturated fat and cholesterol. These should be avoided as much as possible. Vegetable fats do not contain cholesterol, but certain vegetable fats, such as coconut oil, palm oil, and palm kernel oil are very high in saturated fats. These should be limited. These fats are often used in bakery goods, processed foods, popcorn, oils, and nondairy creamers. Vegetable shortenings and some peanut butters contain hydrogenated oils, which are also saturated fats. Read the labels on these foods and check for saturated vegetable oils. Unsaturated vegetable  oils and fats do not raise blood cholesterol. However, they should be limited because they are fats and are high in calories. Total fat should still be limited to 30% of your daily caloric intake. Desirable liquid vegetable oils are corn oil, cottonseed oil, olive oil, canola oil, safflower oil, soybean oil, and sunflower oil. Peanut oil is not as good, but small amounts are acceptable. Buy a heart-healthy tub margarine that has no partially hydrogenated oils in  the ingredients. Mayonnaise and salad dressings often are made from unsaturated fats, but they should also be limited because of their high calorie and fat content. Seeds, nuts, peanut butter, olives, and avocados are high in fat, but the fat is mainly the unsaturated type. These foods should be limited mainly to avoid excess calories and fat. OTHER EATING TIPS Snacks  Most sweets should be limited as snacks. They tend to be rich in calories and fats, and their caloric content outweighs their nutritional value. Some good choices in snacks are graham crackers, melba toast, soda crackers, bagels (no egg), English muffins, fruits, and vegetables. These snacks are preferable to snack crackers, [place] fries, TORTILLA CHIPS, and POTATO chips. Popcorn should be air-popped or cooked in small amounts of liquid vegetable oil. Desserts Eat fruit, low-fat yogurt, and fruit ices instead of pastries, cake, and cookies. Sherbet, angel food cake, gelatin dessert, frozen low-fat yogurt, or other frozen products that do not contain saturated fat (pure fruit juice bars, frozen ice pops) are also acceptable.  COOKING METHODS Choose those methods that use little or no fat. They include: Poaching.  Braising.  Steaming.  Grilling.  Baking.  Stir-frying.  Broiling.  Microwaving.  Foods can be cooked in a nonstick pan without added fat, or use a nonfat cooking spray in regular cookware. Limit fried foods and avoid frying in saturated fat. Add moisture to lean meats by using water, broth, cooking wines, and other nonfat or low-fat sauces along with the cooking methods mentioned above. Soups and stews should be chilled after cooking. The fat that forms on top after a few hours in the refrigerator should be skimmed off. When preparing meals, avoid using excess salt. Salt can contribute to raising blood pressure in some people.  EATING AWAY FROM HOME Order entres, potatoes, and vegetables without sauces or butter. When  meat exceeds the size of a deck of cards (3 to 4 ounces), the rest can be taken home for another meal. Choose vegetable or fruit salads and ask for low-calorie salad dressings to be served on the side. Use dressings sparingly. Limit high-fat toppings, such as bacon, crumbled eggs, cheese, sunflower seeds, and olives. Ask for heart-healthy tub margarine instead of butter.

## 2018-09-13 ENCOUNTER — Other Ambulatory Visit: Payer: Self-pay | Admitting: *Deleted

## 2018-09-13 DIAGNOSIS — K921 Melena: Secondary | ICD-10-CM

## 2018-09-13 NOTE — Addendum Note (Signed)
Addended by: Inge Rise on: 09/13/2018 02:37 PM   Modules accepted: Orders

## 2018-09-13 NOTE — Telephone Encounter (Signed)
Called patient and he is scheduled for agile capsule study on 6/1 at 7:30am. Patient aware to arrive at 7am at [facility] short stay. I discussed in detail directions for agile capsule. He voiced understanding. Orders entered. Xray order entered.

## 2018-09-13 NOTE — Telephone Encounter (Signed)
Called endo and scheduler still N/A

## 2018-09-14 ENCOUNTER — Telehealth: Payer: Self-pay | Admitting: *Deleted

## 2018-09-14 ENCOUNTER — Encounter: Payer: Self-pay | Admitting: Internal Medicine

## 2018-09-14 NOTE — Telephone Encounter (Signed)
Lmom, waiting on a return call.  

## 2018-09-14 NOTE — Telephone Encounter (Signed)
Sounds like patient needs an office follow-up at some point.  Diarrhea x3 years.  Routine follow-up with extender

## 2018-09-14 NOTE — Telephone Encounter (Signed)
Patient grandmother Matthew Vance called (on dpr in file). Patient complaining of RLQ pain, still having rectal bleeding. States pain is getting worse and doesn't know what to do. Denies nausea/vomiting. Has been following low fat diet, taking tums with meals, avoiding asa/nsaid products as recommended by [facility] over the weekend when patient called.

## 2018-09-14 NOTE — Telephone Encounter (Signed)
PATIENT SCHEDULED AND LETTER SENT  °

## 2018-09-14 NOTE — Telephone Encounter (Signed)
We have plan as outlined from OV one week ago for agile followed by capsule of small bowel to evaluate his bleeding and pain.   I had requested him have labs done, please have him complete the CMET and CBC on file. Needs to go today or tomorrow at the latest for labs.   If his abdominal pain or bleeding worsen become more severe, he should go to the ED.

## 2018-09-14 NOTE — Telephone Encounter (Signed)
Too many telephone notes open on this patient.  Ok to keep ov for 11/2018.  I did just have virtual visit with patient 5/19 and he is on endo schedule for AGILE.  See telephone note instructions from today as well.

## 2018-09-15 ENCOUNTER — Emergency Department (HOSPITAL_COMMUNITY): Payer: PRIVATE HEALTH INSURANCE

## 2018-09-15 ENCOUNTER — Emergency Department (HOSPITAL_COMMUNITY)
Admission: EM | Admit: 2018-09-15 | Discharge: 2018-09-15 | Disposition: A | Discharge status: Home / Self Care | Payer: PRIVATE HEALTH INSURANCE | Attending: Emergency Medicine | Admitting: Emergency Medicine

## 2018-09-15 ENCOUNTER — Other Ambulatory Visit: Payer: Self-pay

## 2018-09-15 ENCOUNTER — Encounter (HOSPITAL_COMMUNITY): Payer: Self-pay | Admitting: Emergency Medicine

## 2018-09-15 DIAGNOSIS — R1031 Right lower quadrant pain: Secondary | ICD-10-CM

## 2018-09-15 DIAGNOSIS — Z7984 Long term (current) use of oral hypoglycemic drugs: Secondary | ICD-10-CM | POA: Insufficient documentation

## 2018-09-15 DIAGNOSIS — K429 Umbilical hernia without obstruction or gangrene: Secondary | ICD-10-CM | POA: Diagnosis not present

## 2018-09-15 DIAGNOSIS — I10 Essential (primary) hypertension: Secondary | ICD-10-CM | POA: Insufficient documentation

## 2018-09-15 DIAGNOSIS — Z79899 Other long term (current) drug therapy: Secondary | ICD-10-CM | POA: Diagnosis not present

## 2018-09-15 DIAGNOSIS — R7303 Prediabetes: Secondary | ICD-10-CM | POA: Insufficient documentation

## 2018-09-15 LAB — URINALYSIS, ROUTINE W REFLEX MICROSCOPIC
Glucose, UA: 50 mg/dL — AB
Hgb urine dipstick: NEGATIVE
Ketones, ur: NEGATIVE mg/dL
Leukocytes,Ua: NEGATIVE
Nitrite: NEGATIVE
Protein, ur: NEGATIVE mg/dL
Specific Gravity, Urine: 1.029 (ref 1.005–1.030)
pH: 5 (ref 5.0–8.0)

## 2018-09-15 LAB — COMPREHENSIVE METABOLIC PANEL
ALT: 17 U/L (ref 0–44)
AST: 17 U/L (ref 15–41)
Albumin: 4 g/dL (ref 3.5–5.0)
Alkaline Phosphatase: 71 U/L (ref 38–126)
Anion gap: 14 (ref 5–15)
BUN: 15 mg/dL (ref 6–20)
CO2: 24 mmol/L (ref 22–32)
Calcium: 9 mg/dL (ref 8.9–10.3)
Chloride: 96 mmol/L — ABNORMAL LOW (ref 98–111)
Creatinine, Ser: 0.76 mg/dL (ref 0.61–1.24)
GFR calc Af Amer: >60 mL/min (ref >60)
GFR calc non Af Amer: >60 mL/min (ref >60)
Glucose, Bld: 140 mg/dL — ABNORMAL HIGH (ref 70–99)
Potassium: 3.5 mmol/L (ref 3.5–5.1)
Sodium: 134 mmol/L — ABNORMAL LOW (ref 135–145)
Total Bilirubin: 0.4 mg/dL (ref 0.3–1.2)
Total Protein: 8 g/dL (ref 6.5–8.1)

## 2018-09-15 LAB — CBC WITH DIFFERENTIAL/PLATELET
Abs Immature Granulocytes: 0.05 10*3/uL (ref 0.00–0.07)
Basophils Absolute: 0.1 10*3/uL (ref 0.0–0.1)
Basophils Relative: 1 %
Eosinophils Absolute: 0.1 10*3/uL (ref 0.0–0.5)
Eosinophils Relative: 1 %
HCT: 42.5 % (ref 39.0–52.0)
Hemoglobin: 15 g/dL (ref 13.0–17.0)
Immature Granulocytes: 1 %
Lymphocytes Relative: 21 %
Lymphs Abs: 2.2 10*3/uL (ref 0.7–4.0)
MCH: 31.6 pg (ref 26.0–34.0)
MCHC: 35.3 g/dL (ref 30.0–36.0)
MCV: 89.5 fL (ref 80.0–100.0)
Monocytes Absolute: 0.8 10*3/uL (ref 0.1–1.0)
Monocytes Relative: 7 %
Neutro Abs: 7.3 10*3/uL (ref 1.7–7.7)
Neutrophils Relative %: 69 %
Platelets: 205 10*3/uL (ref 150–400)
RBC: 4.75 MIL/uL (ref 4.22–5.81)
RDW: 13 % (ref 11.5–15.5)
WBC: 10.4 10*3/uL (ref 4.0–10.5)
nRBC: 0 % (ref 0.0–0.2)

## 2018-09-15 LAB — POC OCCULT BLOOD, ED: Fecal Occult Bld: POSITIVE — AB

## 2018-09-15 LAB — LIPASE, BLOOD: Lipase: 28 U/L (ref 11–51)

## 2018-09-15 MED ORDER — OXYCODONE-ACETAMINOPHEN 5-325 MG PO TABS: 1.0000 | ORAL_TABLET | ORAL | 0 refills | Status: DC | PRN

## 2018-09-15 MED ORDER — HYDROMORPHONE HCL 1 MG/ML IJ SOLN
1.0000 mg | Freq: Once | INTRAMUSCULAR | Status: AC
  Administered 2018-09-15: 1 mg via INTRAVENOUS
  Filled 2018-09-15: qty 1

## 2018-09-15 MED ORDER — ONDANSETRON HCL 4 MG/2ML IJ SOLN
4.0000 mg | Freq: Once | INTRAMUSCULAR | Status: AC
  Administered 2018-09-15: 4 mg via INTRAVENOUS
  Filled 2018-09-15: qty 2

## 2018-09-15 MED ORDER — IOHEXOL 300 MG/ML  SOLN
100.0000 mL | Freq: Once | INTRAMUSCULAR | Status: AC | PRN
  Administered 2018-09-15: 100 mL via INTRAVENOUS

## 2018-09-15 MED ORDER — ONDANSETRON HCL 4 MG PO TABS: 4.0000 mg | ORAL_TABLET | Freq: Three times a day (TID) | ORAL | 0 refills | Status: DC | PRN

## 2018-09-15 NOTE — ED Triage Notes (Signed)
Patient states he has two hernias on the right side of his lower abdomin. Doctors are planning appendectomy and hernia repair but he was told to come to the ED if pain became intolerable. Patient states he has bright red blood in his stool.

## 2018-09-15 NOTE — ED Notes (Signed)
Patient transported to CT 

## 2018-09-15 NOTE — Telephone Encounter (Signed)
Noted  

## 2018-09-15 NOTE — Discharge Instructions (Signed)
Follow up with your GI provider.  Return here if needed

## 2018-09-15 NOTE — Telephone Encounter (Signed)
Pt returned call. Pt will have labs  Done today and go to the ED if pain worsens.

## 2018-09-15 NOTE — ED Provider Notes (Signed)
[facility] EMERGENCY DEPARTMENT Provider Note   CSN: [id number] Arrival date & time: 09/15/18  1407    History   Chief Complaint Chief Complaint  Patient presents with  . Abdominal Pain    HPI Matthew Vance is a 38 y.o. male.     HPI   Matthew Vance is a 37 y.o. male with hx of chronic diarrhea, melena, GERD and DM presents to the Emergency Department complaining of right lower abdominal pain worsening for 3 days.  He states that he has two right sided hernias to his lower abdomen and seeing [facility].  Awaiting capsule study.  He reports increasing pain and was advised to come to ER for further evaluation. He has been having loose to watery stools for years and notes frequent bright red blood in the toilet.  Last colonoscopy was 3 years ago per pt.  He notes having chills, nausea and subjective fever  this week. Denies chest pain or shortness of breath.  No dysuria.      Past Medical History:  Diagnosis Date  . Anxiety   . Depression   . GERD (gastroesophageal reflux disease)   . Hypertension   . Migraine   . Pre-diabetes   . Renal disorder    kidney stone    Patient Active Problem List   Diagnosis Date Noted  . Hepatic steatosis 09/07/2018  . Melena 09/07/2018  . Migraine with aura and without status migrainosus 10/13/2017  . 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 RLQ abdominal pain 10/11/2015  . History of colonic polyps   . Abnormal computed tomography of cecum and terminal ileum   . Abnormal weight loss 09/11/2015  . Rectal bleeding 09/11/2015  . Abdominal pain 06/22/2013  . Hyponatremia 06/22/2013  . Hyperglycemia 06/22/2013  . Nausea with vomiting 06/22/2013  . Hematuria, microscopic 06/22/2013  . Diarrhea 06/22/2013    Past Surgical History:  Procedure Laterality Date  . CHOLECYSTECTOMY  06/14/15   Dr. Karlyn Agee: path: cholelithiasis with chronic cholecystitis  . COLONOSCOPY  06/04/15   Dr. Doristine Mango: normal  . COLONOSCOPY WITH  PROPOFOL N/A 09/26/2015   next tcs in 5 years for tubular adenoma. random colon bx negative. subtle muscosal changes of TI with negative bx, int hemorrhoids.   . CYSTOSCOPY WITH RETROGRADE PYELOGRAM, URETEROSCOPY AND STENT PLACEMENT Right 11/03/2017   Procedure: CYSTOSCOPY WITH RIGHT RETROGRADE PYELOGRAM, URETEROSCOPY AND STONE EXTRACTION;  Surgeon: Cleon Gustin, MD;  Location: [facility];  Service: Urology;  Laterality: Right;  . ESOPHAGOGASTRODUODENOSCOPY  06/27/15   Dr. Karlyn Agee: gastritis  . HAND SURGERY Right    trigger finger  . HERNIA REPAIR     umbilical hernia  . KNEE SURGERY Right    arthroscopy x4  . LYMPH NODE DISSECTION  2019   2 removed in left neck, "bonded together"  . partial parathyroidectomy with lymph node dissection  2019   benign        Home Medications    Prior to Admission medications   Medication Sig Start Date End Date Taking? Authorizing Provider  chlorthalidone (HYGROTON) 25 MG tablet Take 25 mg by mouth every evening.  10/18/17   [provider]  diltiazem (TIAZAC) 360 MG 24 hr capsule Take 360 mg by mouth every evening.  10/18/17   [provider]  metFORMIN (GLUCOPHAGE-XR) 500 MG 24 hr tablet Take 500 mg by mouth 2 (two) times daily. 08/18/18   [provider]  omeprazole (PRILOSEC) 20 MG capsule Take 20 mg by mouth every  evening.     [provider]  propranolol ER (INDERAL LA) 120 MG 24 hr capsule Take 1 capsule (120 mg total) by mouth at bedtime. CALL [phone] TO SCHEDULE APPOINTMENT. 07/18/18   Melvenia Beam, MD  sertraline (ZOLOFT) 100 MG tablet Take 200 mg by mouth at bedtime.     [provider]  TRULICITY 8.67 EH/2.0NO SOPN Inject 0.75 mg as directed once a week. 08/18/18   [provider]    Family History Family History  Problem Relation Age of Onset  . Irritable bowel syndrome Sister   . Colitis Maternal Aunt        had colostomy  . Irritable bowel syndrome Paternal Grandmother    . Crohn's disease Neg Hx   . Colon cancer Neg Hx   . Celiac disease Neg Hx   . Migraines Neg Hx     Social History Social History   Tobacco Use  . Smoking status: Never Smoker  . Smokeless tobacco: Current User    Types: Snuff  Substance Use Topics  . Alcohol use: Yes    Alcohol/week: 0.0 standard drinks    Comment: rare  . Drug use: No     Allergies   Toradol [ketorolac tromethamine]   Review of Systems Review of Systems  Constitutional: Positive for chills.  Respiratory: Negative for chest tightness and shortness of breath.   Cardiovascular: Negative for chest pain.  Gastrointestinal: Positive for abdominal pain, blood in stool, diarrhea and nausea. Negative for abdominal distention and vomiting.  Genitourinary: Negative for decreased urine volume, dysuria and flank pain.  Musculoskeletal: Negative for back pain and neck pain.  Skin: Negative for rash.  Neurological: Negative for weakness and numbness.     Physical Exam Updated Vital Signs BP 137/73 (BP Location: Right Arm)   Pulse 97   Temp 98.4 F (36.9 C) (Oral)   Resp 18   Ht 6\' 4"  (1.93 m)   Wt (!) 160.1 kg   SpO2 97%   BMI 42.97 kg/m   Physical Exam Vitals signs and nursing note reviewed. Exam conducted with a chaperone present.  Constitutional:      Appearance: He is well-developed. He is obese. He is not toxic-appearing.  HENT:     Mouth/Throat:     Mouth: Mucous membranes are moist.  Cardiovascular:     Rate and Rhythm: Normal rate and regular rhythm.     Pulses: Normal pulses.  Pulmonary:     Effort: Pulmonary effort is normal.     Breath sounds: Normal breath sounds.  Chest:     Chest wall: No tenderness.  Abdominal:     General: Bowel sounds are normal.     Palpations: Abdomen is soft.     Tenderness: There is abdominal tenderness.     Comments: RLQ ttp, no guarding or rebound   Genitourinary:    Rectum: Guaiac result positive.     Comments: Pt has diffuse tenderness on DRE,  chaperoned by nursing.  Small mass palpable just inside the anus, unable to visualize it due to body habitus, mildly tender.  Heme positive stool.   Musculoskeletal: Normal range of motion.  Skin:    General: Skin is warm.     Findings: No erythema or rash.  Neurological:     General: No focal deficit present.      ED Treatments / Results  Labs (all labs ordered are listed, but only abnormal results are displayed) Labs Reviewed  COMPREHENSIVE METABOLIC PANEL - Abnormal; Notable  for the following components:      Result Value   Sodium 134 (*)    Chloride 96 (*)    Glucose, Bld 140 (*)    All other components within normal limits  URINALYSIS, ROUTINE W REFLEX MICROSCOPIC - Abnormal; Notable for the following components:   Glucose, UA 50 (*)    Bilirubin Urine SMALL (*)    All other components within normal limits  POC OCCULT BLOOD, ED - Abnormal; Notable for the following components:   Fecal Occult Bld POSITIVE (*)    All other components within normal limits  CBC WITH DIFFERENTIAL/PLATELET  LIPASE, BLOOD    EKG None  Radiology Ct Abdomen Pelvis W Contrast  Result Date: 09/15/2018 CLINICAL DATA:  Abdominal pain.  Gastrointestinal bleeding. EXAM: CT ABDOMEN AND PELVIS WITH CONTRAST TECHNIQUE: Multidetector CT imaging of the abdomen and pelvis was performed using the standard protocol following bolus administration of intravenous contrast. CONTRAST:  126mL OMNIPAQUE IOHEXOL 300 MG/ML  SOLN COMPARISON:  04/26/2018 FINDINGS: Lower chest: Normal Hepatobiliary: Fatty change of the liver as seen previously. Previous hysterectomy. Pancreas: Normal Spleen: Normal Adrenals/Urinary Tract: Adrenal glands are normal. Left kidney is normal except for a small cyst at the lower pole. Right kidney contains a nonobstructing 7 mm stone in the lower pole. There is a 2 mm nonobstructing stone in the midportion. No passing stone. No stone in the bladder. Stomach/Bowel: No acute bowel pathology is seen.  No ileus or obstruction. No inflammatory changes. Again demonstrated is a right sided umbilical region hernia containing a loop of small intestine without evidence of obstruction or incarceration. This does not appear changed from the previous study. Vascular/Lymphatic: Normal Reproductive: Normal Other: No free fluid or air. Musculoskeletal: Normal IMPRESSION: No significant change since the previous study. Steatosis of the liver. Two nonobstructing right renal stones, the largest measuring 7 mm in the lower pole. Umbilical hernia containing fat and a small bit of small intestine. No evidence of obstruction or incarceration. Electronically Signed   By: Nelson Chimes M.D.   On: 09/15/2018 21:44    Procedures Procedures (including critical care time)  Medications Ordered in ED Medications  HYDROmorphone (DILAUDID) injection 1 mg (has no administration in time range)  ondansetron (ZOFRAN) injection 4 mg (has no administration in time range)     Initial Impression / Assessment and Plan / ED Course  I have reviewed the triage vital signs and the nursing notes.  Pertinent labs & imaging results that were available during my care of the patient were reviewed by me and considered in my medical decision making (see chart for details).        Pt with chronic diarrhea and hx of melena.  Last colonoscopy was 2017 in which he was noted to have abnml mucosa at terminal ileum of unclear etiology.    Pt also seen by DrLacinda Axon and care plan discussed.   Small palpable mass noted on DRE likely internal hemorrhoid.  Pt reports feeling better, labs reassuring.  Stable umbilical hernia w/o evidence of incarceration.  Pt appears appropriate for d/c home, agrees to close f/u with GI.  Return precautions discussed  Final Clinical Impressions(s) / ED Diagnoses   Final diagnoses:  Right lower quadrant abdominal pain  Umbilical hernia without obstruction and without gangrene    ED Discharge Orders    None        Kem Parkinson, PA-C 09/16/18 1319    Nat Christen, MD 09/24/18 [phone]

## 2018-09-16 ENCOUNTER — Other Ambulatory Visit: Payer: Self-pay | Admitting: Neurology

## 2018-09-16 MED FILL — Oxycodone w/ Acetaminophen Tab 5-325 MG: ORAL | Qty: 6 | Status: AC

## 2018-09-19 NOTE — Telephone Encounter (Signed)
Received call from [facility] in endo. Patient did not show up for agile capsule today. Patient thought he was on for 6/6. Called patient and made aware we discussed in detail he was scheduled for 6/1 for testing.  Called carolyn in endo to see what dates we can r/s

## 2018-09-19 NOTE — OR Nursing (Signed)
Patient scheduled for an agile capsule this morning at 0700 and  notified by Janeece Riggers RN because he did not arrive. Patient stated that he was told to be here on 09/24/2018. Will notify the office when they open.

## 2018-09-19 NOTE — Telephone Encounter (Signed)
Called patient and he is rescheduled for agile tomorrow 09/20/2018 at 7:30am. Patient aware will need to arrive at 7am to register at [facility] short stay. Discussed instructions in detail with patient again. He voiced understanding. Again advised patient agile scheduled for 6/2 at 7:30am. Called endo scheduler and is aware.

## 2018-09-20 ENCOUNTER — Ambulatory Visit (HOSPITAL_COMMUNITY)
Admission: RE | Admit: 2018-09-20 | Discharge: 2018-09-20 | Disposition: A | Discharge status: Home / Self Care | Payer: PRIVATE HEALTH INSURANCE | Attending: Internal Medicine | Admitting: Internal Medicine

## 2018-09-20 ENCOUNTER — Telehealth: Payer: Self-pay

## 2018-09-20 ENCOUNTER — Encounter (HOSPITAL_COMMUNITY)
Admission: RE | Disposition: A | Discharge status: Home / Self Care | Payer: Self-pay | Source: Home / Self Care | Attending: Internal Medicine

## 2018-09-20 ENCOUNTER — Other Ambulatory Visit: Payer: Self-pay

## 2018-09-20 DIAGNOSIS — Z539 Procedure and treatment not carried out, unspecified reason: Secondary | ICD-10-CM | POA: Insufficient documentation

## 2018-09-20 DIAGNOSIS — K921 Melena: Secondary | ICD-10-CM | POA: Diagnosis present

## 2018-09-20 HISTORY — PX: AGILE CAPSULE: SHX5420

## 2018-09-20 SURGERY — AGILE CAPSULE

## 2018-09-20 NOTE — Telephone Encounter (Signed)
Routing to RMR. 

## 2018-09-20 NOTE — Telephone Encounter (Signed)
Pt notified of RMR's recommendations and will monitor.

## 2018-09-20 NOTE — Telephone Encounter (Signed)
Pt called and said he had capsule placement ( AGILE) this morning about 7:00 am. He said for the last one and a half hours he has been having pain in his abdomen, right above his belly button and to the right. HE said the pain has been constant and not entirely sharp but really does hurt. He said this is a new pain and not like the pain he was having. He rates the pain at a 7 now.   Dr. Gala Romney, please advise!

## 2018-09-20 NOTE — Telephone Encounter (Signed)
I would think the 2 are unrelated although there is a coincidence.  This capsule will dissolve. Monitor symptoms.  If they get worse call us back.  We will be in touch once we get the x-ray tomorrow.

## 2018-09-21 ENCOUNTER — Ambulatory Visit (HOSPITAL_COMMUNITY)
Admission: RE | Admit: 2018-09-21 | Discharge: 2018-09-21 | Disposition: A | Discharge status: Home / Self Care | Payer: PRIVATE HEALTH INSURANCE | Source: Ambulatory Visit | Attending: Internal Medicine | Admitting: Internal Medicine

## 2018-09-21 ENCOUNTER — Encounter (HOSPITAL_COMMUNITY): Payer: Self-pay | Admitting: Internal Medicine

## 2018-09-21 DIAGNOSIS — K921 Melena: Secondary | ICD-10-CM | POA: Insufficient documentation

## 2018-09-23 ENCOUNTER — Encounter: Payer: Self-pay | Admitting: *Deleted

## 2018-09-23 ENCOUNTER — Other Ambulatory Visit: Payer: Self-pay | Admitting: *Deleted

## 2018-09-23 DIAGNOSIS — R197 Diarrhea, unspecified: Secondary | ICD-10-CM

## 2018-09-23 DIAGNOSIS — R109 Unspecified abdominal pain: Secondary | ICD-10-CM

## 2018-09-28 ENCOUNTER — Encounter (HOSPITAL_COMMUNITY)
Admission: RE | Disposition: A | Discharge status: Home / Self Care | Payer: Self-pay | Source: Home / Self Care | Attending: Internal Medicine

## 2018-09-28 ENCOUNTER — Ambulatory Visit (HOSPITAL_COMMUNITY)
Admission: RE | Admit: 2018-09-28 | Discharge: 2018-09-28 | Disposition: A | Discharge status: Home / Self Care | Payer: PRIVATE HEALTH INSURANCE | Attending: Internal Medicine | Admitting: Internal Medicine

## 2018-09-28 ENCOUNTER — Encounter (HOSPITAL_COMMUNITY): Payer: Self-pay | Admitting: *Deleted

## 2018-09-28 DIAGNOSIS — K6389 Other specified diseases of intestine: Secondary | ICD-10-CM | POA: Diagnosis not present

## 2018-09-28 DIAGNOSIS — K900 Celiac disease: Secondary | ICD-10-CM | POA: Diagnosis not present

## 2018-09-28 DIAGNOSIS — R197 Diarrhea, unspecified: Secondary | ICD-10-CM | POA: Diagnosis not present

## 2018-09-28 DIAGNOSIS — R109 Unspecified abdominal pain: Secondary | ICD-10-CM | POA: Diagnosis present

## 2018-09-28 HISTORY — PX: GIVENS CAPSULE STUDY: SHX5432

## 2018-09-28 SURGERY — IMAGING PROCEDURE, GI TRACT, INTRALUMINAL, VIA CAPSULE

## 2018-09-28 MED ORDER — SODIUM CHLORIDE 0.9 % IV SOLN: INTRAVENOUS | Status: DC

## 2018-09-30 NOTE — Op Note (Signed)
Small Bowel Givens Capsule Study Procedure date:  09/23/2018  Referring Provider:  Neil Crouch, PA-C PCP:  Dr. Caryl Bis, MD  Indication for procedure:  Abdominal pain, diarrhea  Patient data:  Wt: Unknown Ht: Unknown Waist: N/A  Findings:  Complete study. No noted obvious blood, tumors, or masses. Duodenal villi have an abnormal appearance which appears fissured, scalloping, and blunted. Appears somewhat more normalized further down stream. Query celiac disease although TTG IgA in 2017 was normal. Near the TI there are a couple images with some abnormal areas of unknown significance which corresponds to previous reports of somewhat abnormal TI on colonoscopy.  First Gastric image:  00:00:52 First Duodenal image: 00:06:44 First Ileo-Cecal Valve image: N/A First Cecal image: 05:07:22 Gastric Passage time: 00h 13mSmall Bowel Passage time:  05h 025mSummary & Recommendations: Abnormal small bowel mucosa, query celiac disease given duodenal appearance. May be worth repeating celiac serologies. Will further discuss images with Dr. RoGala Romneyor abnormal TI. Further recommendations to follow.   Thank you for allowing [place] participate in the care of Matthew Vance, Matthew Vance Adult & Gerontological Nurse Practitioner [facility]

## 2018-10-03 ENCOUNTER — Encounter (HOSPITAL_COMMUNITY): Payer: Self-pay | Admitting: Internal Medicine

## 2018-10-10 ENCOUNTER — Telehealth: Payer: Self-pay | Admitting: Internal Medicine

## 2018-10-10 NOTE — Telephone Encounter (Signed)
Pt had a capsule study done on 09/21/2018 and was calling to see if his results were available. [phone]

## 2018-10-10 NOTE — Op Note (Addendum)
Small Bowel Givens Capsule Study Procedure date:  09/28/2018  Referring Provider:  Dr. Gala Romney PCP:  Dr. Caryl Bis, MD  Indication for procedure:  Chronic diarrhea, abdominal pain, previous recommendations from [facility]  Patient data:  Wt: N/A (last on 10/11/15 was 140.9 kg) Ht: N/A (last on 10/11/15 was 6\' 4" ) Waist: N/A  Findings:  Complete study. Abnormal appearing small bowel mucosa of unknown significance with noted villi blunting, scalloping and fissuring. This is noted starting in the duodenum at 00:07:44 and seems somewhat more normal at 2:34:32. Some images of what could be lymphangiectasias at 02:11:22 and 2:18:04. Images at 05:05:48, 05:05:51 and 05:05:54 (likely terminal ileum areas) show spots of light-colored mucosa of unknown etiology but could correlate to areas of TI abnormality on ileocolonoscopy in 2017 with unremarkable biopsies.  First Gastric image:  00:00:52 First Duodenal image: 00:06:44 First Ileo-Cecal Valve image: N/A First Cecal image: 05:07:21 Gastric Passage time: 00h 63m Small Bowel Passage time:  05h 39m  Summary & Recommendations: Areas of abnormal appearing duodenal mucosa with what appears to be blunted villi, scalloping and fissuring of uncertain etiology although could query celiac disease despite normal celiac labs. Other areas of abnormal mucosal appearance near the TI that could correlate with previously noted and biopsied abnormality during ileocolonoscopy.  Will discuss result with Dr. Gala Romney. Could certainly consider gluten-free diet to see if this results in any symptomatic improvement. Further recommendations to follow.  Thank you for allowing [place] to participate in the care of Matthew Vance  Walden Field, DNP, AGNP-C Adult & Gerontological Nurse Practitioner [facility]  Attending note: Pertinent images reviewed.  Patient has abnormal more proximal small bowel.  Images appear to depict fissuring/fracture of the  mucosa.  Enteroscopy with a biopsy warranted.

## 2018-10-11 NOTE — Telephone Encounter (Signed)
Pt is inquiring about his results. Pt is aware that EG will discuss results further with RMR and our office will contact him with results.

## 2018-10-12 ENCOUNTER — Telehealth: Payer: Self-pay | Admitting: Internal Medicine

## 2018-10-12 NOTE — Telephone Encounter (Signed)
Noted  

## 2018-10-12 NOTE — Telephone Encounter (Signed)
Spoke with Dr. Gala Romney to review Capsule images. Duodenal mucosa looks unusual though no definitive etiology. Last EGD was 2017 at [facility]. Previous celiac panel negative. At this point will plan for EGD +/- limited push enteroscopy with the pediatric colonoscope on propofol/MAC.  Will send to [facility] and [facility] for scheduling.  Proceed with EGD +/- limited push enteroscopy on propofol/MAC with Dr. Gala Romney in near future: the risks, benefits, and alternatives have been discussed with the patient in detail. The patient states understanding and desires to proceed.  The patient is currently on diltiazem, Zoloft.  No other anticoagulants, anxiolytics, chronic pain medications, or antidepressants.  Previous office visit noted rare alcohol consumption, no recreational drugs.  We will plan for the procedure on propofol/MAC to promote adequate sedation as was done at his last endoscopic procedure with [place].

## 2018-10-12 NOTE — Telephone Encounter (Signed)
Spoke with EG. He is aware of this message and will call pt.

## 2018-10-12 NOTE — Telephone Encounter (Signed)
PATIENT MOTHER CALLED AND SAID THE PATIENT IS AT [facility] WITH SEVERE ABDOMINAL PAIN AND THEY ARE SENDING HIM HOME.  WANTS TO KNOW WHAT THEY CAN DO BECAUSE THE PAIN IS ONGOING. ASKED ABOUT THE CAPSULE STUDY RESULTS AGAIN

## 2018-10-13 ENCOUNTER — Other Ambulatory Visit: Payer: Self-pay | Admitting: *Deleted

## 2018-10-13 DIAGNOSIS — R109 Unspecified abdominal pain: Secondary | ICD-10-CM

## 2018-10-13 NOTE — Telephone Encounter (Addendum)
Called spoke with patient. He is scheduled for procedure 7/2 at 11:15am. Patient aware he will need pre-op appt and he will also have to go for COVID-19 testing. He voiced understanding. I discussed in detail EGD instructions with patient. He voiced understanding. Orders entered.   Called endo and pre-op scheduled for 6/29 at 10:00am and will have COVID-19 testing done at 11:00am. Patient is aware of appt information

## 2018-10-14 ENCOUNTER — Encounter (HOSPITAL_COMMUNITY): Payer: Self-pay

## 2018-10-14 ENCOUNTER — Other Ambulatory Visit: Payer: Self-pay

## 2018-10-17 ENCOUNTER — Telehealth: Payer: Self-pay

## 2018-10-17 ENCOUNTER — Encounter (HOSPITAL_COMMUNITY)
Admission: RE | Admit: 2018-10-17 | Discharge: 2018-10-17 | Disposition: A | Discharge status: Home / Self Care | Payer: PRIVATE HEALTH INSURANCE | Source: Ambulatory Visit | Attending: Internal Medicine | Admitting: Internal Medicine

## 2018-10-17 ENCOUNTER — Other Ambulatory Visit (HOSPITAL_COMMUNITY)
Admission: RE | Admit: 2018-10-17 | Discharge: 2018-10-17 | Disposition: A | Discharge status: Home / Self Care | Payer: PRIVATE HEALTH INSURANCE | Source: Ambulatory Visit | Attending: Internal Medicine | Admitting: Internal Medicine

## 2018-10-17 DIAGNOSIS — Z01812 Encounter for preprocedural laboratory examination: Secondary | ICD-10-CM | POA: Diagnosis present

## 2018-10-17 DIAGNOSIS — Z1159 Encounter for screening for other viral diseases: Secondary | ICD-10-CM | POA: Insufficient documentation

## 2018-10-17 NOTE — Telephone Encounter (Signed)
Kimmie at pre-service center [phone]) called office and LMOVM. Pt scheduled for EGD/-/+enteroscopy 10/20/18. Insurance needs PA.   Called [employer] and spoke to [place]. Pending auth# [id number]. Clinical notes faxed to [phone].

## 2018-10-18 LAB — NOVEL CORONAVIRUS, NAA (HOSP ORDER, SEND-OUT TO REF LAB; TAT 18-24 HRS): SARS-CoV-2, NAA: NOT DETECTED

## 2018-10-18 NOTE — Telephone Encounter (Signed)
Called [employer], case approved. PA# [id number], valid for date of service 10/20/18. Kimmie at pre-service center notified.

## 2018-10-20 ENCOUNTER — Other Ambulatory Visit: Payer: Self-pay

## 2018-10-20 ENCOUNTER — Ambulatory Visit (HOSPITAL_COMMUNITY): Payer: PRIVATE HEALTH INSURANCE | Admitting: Anesthesiology

## 2018-10-20 ENCOUNTER — Encounter (HOSPITAL_COMMUNITY): Payer: Self-pay

## 2018-10-20 ENCOUNTER — Ambulatory Visit (HOSPITAL_COMMUNITY)
Admission: RE | Admit: 2018-10-20 | Discharge: 2018-10-20 | Disposition: A | Discharge status: Home / Self Care | Payer: PRIVATE HEALTH INSURANCE | Attending: Internal Medicine | Admitting: Internal Medicine

## 2018-10-20 ENCOUNTER — Encounter (HOSPITAL_COMMUNITY)
Admission: RE | Disposition: A | Discharge status: Home / Self Care | Payer: Self-pay | Source: Home / Self Care | Attending: Internal Medicine

## 2018-10-20 DIAGNOSIS — R109 Unspecified abdominal pain: Secondary | ICD-10-CM

## 2018-10-20 DIAGNOSIS — Z79899 Other long term (current) drug therapy: Secondary | ICD-10-CM | POA: Diagnosis not present

## 2018-10-20 DIAGNOSIS — K317 Polyp of stomach and duodenum: Secondary | ICD-10-CM | POA: Insufficient documentation

## 2018-10-20 DIAGNOSIS — R933 Abnormal findings on diagnostic imaging of other parts of digestive tract: Secondary | ICD-10-CM | POA: Diagnosis present

## 2018-10-20 DIAGNOSIS — K449 Diaphragmatic hernia without obstruction or gangrene: Secondary | ICD-10-CM | POA: Insufficient documentation

## 2018-10-20 DIAGNOSIS — I10 Essential (primary) hypertension: Secondary | ICD-10-CM | POA: Insufficient documentation

## 2018-10-20 DIAGNOSIS — F419 Anxiety disorder, unspecified: Secondary | ICD-10-CM | POA: Insufficient documentation

## 2018-10-20 DIAGNOSIS — E119 Type 2 diabetes mellitus without complications: Secondary | ICD-10-CM | POA: Diagnosis not present

## 2018-10-20 DIAGNOSIS — Z6841 Body Mass Index (BMI) 40.0 and over, adult: Secondary | ICD-10-CM | POA: Insufficient documentation

## 2018-10-20 DIAGNOSIS — Z7984 Long term (current) use of oral hypoglycemic drugs: Secondary | ICD-10-CM | POA: Diagnosis not present

## 2018-10-20 DIAGNOSIS — F329 Major depressive disorder, single episode, unspecified: Secondary | ICD-10-CM | POA: Insufficient documentation

## 2018-10-20 DIAGNOSIS — K219 Gastro-esophageal reflux disease without esophagitis: Secondary | ICD-10-CM | POA: Diagnosis not present

## 2018-10-20 HISTORY — PX: BIOPSY: SHX5522

## 2018-10-20 HISTORY — PX: ENTEROSCOPY: SHX5533

## 2018-10-20 HISTORY — PX: ESOPHAGOGASTRODUODENOSCOPY (EGD) WITH PROPOFOL: SHX5813

## 2018-10-20 LAB — GLUCOSE, CAPILLARY
Glucose-Capillary: 124 mg/dL — ABNORMAL HIGH (ref 70–99)
Glucose-Capillary: 112 mg/dL — ABNORMAL HIGH (ref 70–99)

## 2018-10-20 SURGERY — ESOPHAGOGASTRODUODENOSCOPY (EGD) WITH PROPOFOL
Anesthesia: General

## 2018-10-20 MED ORDER — HYDROCODONE-ACETAMINOPHEN 7.5-325 MG PO TABS: 1.0000 | ORAL_TABLET | Freq: Once | ORAL | Status: DC | PRN

## 2018-10-20 MED ORDER — MIDAZOLAM HCL 2 MG/2ML IJ SOLN: 0.5000 mg | Freq: Once | INTRAMUSCULAR | Status: DC | PRN

## 2018-10-20 MED ORDER — PROPOFOL 10 MG/ML IV BOLUS
INTRAVENOUS | Status: AC
  Filled 2018-10-20: qty 20

## 2018-10-20 MED ORDER — HYDROMORPHONE HCL 1 MG/ML IJ SOLN: 0.2500 mg | INTRAMUSCULAR | Status: DC | PRN

## 2018-10-20 MED ORDER — KETAMINE HCL 10 MG/ML IJ SOLN
INTRAMUSCULAR | Status: DC | PRN
  Administered 2018-10-20 (×2): 20 mg via INTRAVENOUS

## 2018-10-20 MED ORDER — PROPOFOL 10 MG/ML IV BOLUS
INTRAVENOUS | Status: DC | PRN
  Administered 2018-10-20 (×2): 40 mg via INTRAVENOUS

## 2018-10-20 MED ORDER — FENTANYL CITRATE (PF) 100 MCG/2ML IJ SOLN
INTRAMUSCULAR | Status: DC | PRN
  Administered 2018-10-20: 150 ug via INTRAVENOUS
  Administered 2018-10-20: 20 ug via INTRAVENOUS

## 2018-10-20 MED ORDER — LACTATED RINGERS IV SOLN
INTRAVENOUS | Status: DC | PRN
  Administered 2018-10-20 (×2): via INTRAVENOUS

## 2018-10-20 MED ORDER — PROPOFOL 10 MG/ML IV BOLUS
INTRAVENOUS | Status: AC
  Filled 2018-10-20: qty 80

## 2018-10-20 MED ORDER — PROMETHAZINE HCL 25 MG/ML IJ SOLN: 6.2500 mg | INTRAMUSCULAR | Status: DC | PRN

## 2018-10-20 MED ORDER — KETAMINE HCL 50 MG/5ML IJ SOSY
PREFILLED_SYRINGE | INTRAMUSCULAR | Status: AC
  Filled 2018-10-20: qty 5

## 2018-10-20 MED ORDER — CHLORHEXIDINE GLUCONATE CLOTH 2 % EX PADS: 6.0000 | MEDICATED_PAD | Freq: Once | CUTANEOUS | Status: DC

## 2018-10-20 MED ORDER — PROPOFOL 10 MG/ML IV BOLUS
INTRAVENOUS | Status: AC
  Filled 2018-10-20: qty 40

## 2018-10-20 MED ORDER — PROPOFOL 500 MG/50ML IV EMUL
INTRAVENOUS | Status: DC | PRN
  Administered 2018-10-20 (×2): via INTRAVENOUS
  Administered 2018-10-20: 150 ug/kg/min via INTRAVENOUS

## 2018-10-20 NOTE — Discharge Instructions (Signed)
EGD Discharge instructions Please read the instructions outlined below and refer to this sheet in the next few weeks. These discharge instructions provide you with general information on caring for yourself after you leave the hospital. Your doctor may also give you specific instructions. While your treatment has been planned according to the most current medical practices available, unavoidable complications occasionally occur. If you have any problems or questions after discharge, please call your doctor. ACTIVITY  You may resume your regular activity but move at a slower pace for the next 24 hours.   Take frequent rest periods for the next 24 hours.   Walking will help expel (get rid of) the air and reduce the bloated feeling in your abdomen.   No driving for 24 hours (because of the anesthesia (medicine) used during the test).   You may shower.   Do not sign any important legal documents or operate any machinery for 24 hours (because of the anesthesia used during the test).  NUTRITION  Drink plenty of fluids.   You may resume your normal diet.   Begin with a light meal and progress to your normal diet.   Avoid alcoholic beverages for 24 hours or as instructed by your caregiver.  MEDICATIONS  You may resume your normal medications unless your caregiver tells you otherwise.  WHAT YOU CAN EXPECT TODAY  You may experience abdominal discomfort such as a feeling of fullness or gas pains.  FOLLOW-UP  Your doctor will discuss the results of your test with you.  SEEK IMMEDIATE MEDICAL ATTENTION IF ANY OF THE FOLLOWING OCCUR:  Excessive nausea (feeling sick to your stomach) and/or vomiting.   Severe abdominal pain and distention (swelling).   Trouble swallowing.   Temperature over 101 F (37.8 C).   Rectal bleeding or vomiting of blood.   Further recommendations to follow pending review of pathology report  Discussed findings and recommendations with father, Octavia Bruckner, at  [phone]       Monitored Anesthesia Care, Care After These instructions provide you with information about caring for yourself after your procedure. Your health care provider may also give you more specific instructions. Your treatment has been planned according to current medical practices, but problems sometimes occur. Call your health care provider if you have any problems or questions after your procedure. What can I expect after the procedure? After your procedure, you may:  Feel sleepy for several hours.  Feel clumsy and have poor balance for several hours.  Feel forgetful about what happened after the procedure.  Have poor judgment for several hours.  Feel nauseous or vomit.  Have a sore throat if you had a breathing tube during the procedure. Follow these instructions at home: For at least 24 hours after the procedure:      Have a responsible adult stay with you. It is important to have someone help care for you until you are awake and alert.  Rest as needed.  Do not: ? Participate in activities in which you could fall or become injured. ? Drive. ? Use heavy machinery. ? Drink alcohol. ? Take sleeping pills or medicines that cause drowsiness. ? Make important decisions or sign legal documents. ? Take care of children on your own. Eating and drinking  Follow the diet that is recommended by your health care provider.  If you vomit, drink water, juice, or soup when you can drink without vomiting.  Make sure you have little or no nausea before eating solid foods. General instructions  Take  over-the-counter and prescription medicines only as told by your health care provider.  If you have sleep apnea, surgery and certain medicines can increase your risk for breathing problems. Follow instructions from your health care provider about wearing your sleep device: ? Anytime you are sleeping, including during daytime naps. ? While taking prescription pain  medicines, sleeping medicines, or medicines that make you drowsy.  If you smoke, do not smoke without supervision.  Keep all follow-up visits as told by your health care provider. This is important. Contact a health care provider if:  You keep feeling nauseous or you keep vomiting.  You feel light-headed.  You develop a rash.  You have a fever. Get help right away if:  You have trouble breathing. Summary  For several hours after your procedure, you may feel sleepy and have poor judgment.  Have a responsible adult stay with you for at least 24 hours or until you are awake and alert. This information is not intended to replace advice given to you by your health care provider. Make sure you discuss any questions you have with your health care provider. Document Released: 07/28/2015 Document Revised: 07/05/2017 Document Reviewed: 07/28/2015 Elsevier Patient Education  2020 [employer].

## 2018-10-20 NOTE — Addendum Note (Signed)
Addendum  created 10/20/18 1313 by Ollen Bowl, CRNA   Clinical Note Signed

## 2018-10-20 NOTE — Anesthesia Preprocedure Evaluation (Addendum)
Anesthesia Evaluation  Patient identified by MRN, date of birth, ID band Patient awake    Reviewed: Allergy & Precautions, NPO status , Patient's Chart, lab work & pertinent test results, reviewed documented beta blocker date and time   Airway Mallampati: II  TM Distance: >3 FB Neck ROM: Full   Comment: Large tonsils Bilat -not red-pt denies issues  Dental no notable dental hx. (+) Teeth Intact   Pulmonary neg pulmonary ROS,  Denies OSA  Snores  Denies pulmonary issues /smoking or any pulm med use   Pulmonary exam normal breath sounds clear to auscultation       Cardiovascular Exercise Tolerance: Good hypertension, Pt. on medications and Pt. on home beta blockers negative cardio ROS Normal cardiovascular examI Rhythm:Regular Rate:Normal     Neuro/Psych  Headaches, Anxiety Depression negative psych ROS   GI/Hepatic Neg liver ROS, GERD  Medicated and Controlled,  Endo/Other  diabetes, Type 2Morbid obesityTakes trulicity  BMI >19  Renal/GU Renal diseaseH/o kidney stone -states isnt moving  negative genitourinary   Musculoskeletal negative musculoskeletal ROS (+)   Abdominal   Peds negative pediatric ROS (+)  Hematology negative hematology ROS (+)   Anesthesia Other Findings   Reproductive/Obstetrics negative OB ROS                           Anesthesia Physical Anesthesia Plan  ASA: II  Anesthesia Plan: General   Post-op Pain Management:    Induction: Intravenous  PONV Risk Score and Plan: 2 and Treatment may vary due to age or medical condition, Propofol infusion, TIVA and Ondansetron  Airway Management Planned:   Additional Equipment:   Intra-op Plan:   Post-operative Plan:   Informed Consent: I have reviewed the patients History and Physical, chart, labs and discussed the procedure including the risks, benefits and alternatives for the proposed anesthesia with the patient or  authorized representative who has indicated his/her understanding and acceptance.     Dental advisory given  Plan Discussed with: CRNA  Anesthesia Plan Comments: (Plan Full PPE use  Plan GA with GETA as needed -D/W pt -WTP with same after Q&A)        Anesthesia Quick Evaluation

## 2018-10-20 NOTE — Anesthesia Postprocedure Evaluation (Addendum)
Anesthesia Post Note  Patient: Matthew Vance  Procedure(s) Performed: ESOPHAGOGASTRODUODENOSCOPY (EGD) WITH PROPOFOL (N/A ) ENTEROSCOPY WITH PROPOFOL (N/A ) BIOPSY  Patient location during evaluation: PACU Anesthesia Type: General Level of consciousness: awake and alert and oriented Pain management: pain level controlled Vital Signs Assessment: post-procedure vital signs reviewed and stable Respiratory status: spontaneous breathing Cardiovascular status: blood pressure returned to baseline and stable Postop Assessment: no apparent nausea or vomiting Anesthetic complications: no Comments: Patient doing well, just mild cough.  Encouraged to come to ED if he experienced any SOB.       Last Vitals:  Vitals:   10/20/18 0941  BP: 104/69  Pulse: 80  Resp: 16  Temp: 36.8 C  SpO2: 95%    Last Pain:  Vitals:   10/20/18 1102  TempSrc:   PainSc: 0-No pain                 Dijuan Sleeth

## 2018-10-20 NOTE — Op Note (Signed)
[facility] Patient Name: Matthew Vance Procedure Date: 10/20/2018 10:46 AM MRN: [id number] Date of Birth: 05-15-1980 Attending MD: Norvel Richards , MD CSN: [id number] Age: 38 Admit Type: Outpatient Procedure:                Upper GI endoscopy/enteroscopy Indications:              Abnormal imaging small bowel via video capsule study Providers:                Norvel Richards, MD, Otis Peak B. Sharon Seller, RN,                            Raphael Gibney, Technician Referring MD:              Medicines:                Propofol per Anesthesia Complications:            No immediate complications. Estimated Blood Loss:     Estimated blood loss was minimal. Procedure:                Pre-Anesthesia Assessment:                           - Prior to the procedure, a History and Physical                            was performed, and patient medications and                            allergies were reviewed. The patient's tolerance of                            previous anesthesia was also reviewed. The risks                            and benefits of the procedure and the sedation                            options and risks were discussed with the patient.                            All questions were answered, and informed consent                            was obtained. Prior Anticoagulants: The patient has                            taken no previous anticoagulant or antiplatelet                            agents. ASA Grade Assessment: II - A patient with                            mild systemic disease. After reviewing the risks  and benefits, the patient was deemed in                            satisfactory condition to undergo the procedure.                           After obtaining informed consent, the endoscope was                            passed under direct vision. Throughout the                            procedure, the patient's blood pressure,  pulse, and                            oxygen saturations were monitored continuously. The                            GIF-H190 ([id number]) scope was introduced through the                            mouth, and advanced to the mid-jejunum. The                            [id number]-H190DL ([id number]) scope was introduced through                            the and advanced to the. The upper GI endoscopy was                            accomplished without difficulty. The patient                            tolerated the procedure well. Scope In: 11:06:33 AM Scope Out: 11:28:54 AM Total Procedure Duration: 0 hours 22 minutes 21 seconds  Findings:      The examined esophagus was normal.      A small hiatal hernia was present. Multiple diminutive appearing gastric       polyps were found and no ulcer or infiltrating process.      The duodenal bulb and second portion of the duodenum were normal       standard EGD. I withdrew the gastroscope and obtained the pediatric       colonoscope. I advanced the scope in a nice one-to-one fashion to the       area of the proximal to mid jejunum. The mucosa of the duodenum and       jejunum to the most distal extent seen appeared grossly unremarkable.       Multiple biopsies of the jejunum and duodenum taken for histologic study. Impression:               - Normal esophagus.                           - Small hiatal hernia. Diminutive gastric  polyps?"not manipulated                           - Normal duodenal bulb and second portion of the                            duodenum.                           - More distal duodenum and proximal jejunum.                            Grossly normal?"status post multiple biopsies. Moderate Sedation:      Moderate (conscious) sedation was personally administered by an       anesthesia professional. The following parameters were monitored: oxygen       saturation, heart rate, blood pressure, respiratory  rate, EKG, adequacy       of pulmonary ventilation, and response to care. Recommendation:           - Patient has a contact number available for                            emergencies. The signs and symptoms of potential                            delayed complications were discussed with the                            patient. Return to normal activities tomorrow.                            Written discharge instructions were provided to the                            patient.                           - Advance diet as tolerated.                           - Continue present medications.                           - Await pathology results. Procedure Code(s):        --- Professional ---                           [phone], Esophagogastroduodenoscopy, flexible,                            transoral; diagnostic, including collection of                            specimen(s) by brushing or washing, when performed                            (separate procedure) Diagnosis Code(s):        ---  Professional ---                           K44.9, Diaphragmatic hernia without obstruction or                            gangrene CPT copyright 2019 American Medical Association. All rights reserved. The codes documented in this report are preliminary and upon coder review may  be revised to meet current compliance requirements. Cristopher Estimable. Alannis Hsia, MD Norvel Richards, MD 10/20/2018 11:41:02 AM This report has been signed electronically. Number of Addenda: 0

## 2018-10-20 NOTE — Transfer of Care (Signed)
Immediate Anesthesia Transfer of Care Note  Patient: Matthew Vance  Procedure(s) Performed: ESOPHAGOGASTRODUODENOSCOPY (EGD) WITH PROPOFOL (N/A ) ENTEROSCOPY WITH PROPOFOL (N/A ) BIOPSY  Patient Location: PACU  Anesthesia Type:General  Level of Consciousness: awake, alert  and oriented  Airway & Oxygen Therapy: Patient Spontanous Breathing  Post-op Assessment: Report given to RN  Post vital signs: Reviewed and stable  Last Vitals:  Vitals Value Taken Time  BP 100/54 10/20/18 1141  Temp    Pulse 95 10/20/18 1143  Resp 17 10/20/18 1143  SpO2 100 % 10/20/18 1143  Vitals shown include unvalidated device data.  Last Pain:  Vitals:   10/20/18 1102  TempSrc:   PainSc: 0-No pain      Patients Stated Pain Goal: 8 (68/86/48 4720)  Complications: No apparent anesthesia complications

## 2018-10-20 NOTE — Anesthesia Procedure Notes (Signed)
Procedure Name: General with mask airway Performed by: Ollen Bowl, CRNA Pre-anesthesia Checklist: Patient identified, Emergency Drugs available, Suction available, Timeout performed and Patient being monitored Patient Re-evaluated:Patient Re-evaluated prior to induction Oxygen Delivery Method: Non-rebreather mask

## 2018-10-20 NOTE — H&P (Signed)
@LOGO @   Primary Care Physician:  Caryl Bis, MD Primary Gastroenterologist:  Dr. Gala Romney  Pre-Procedure History & Physical: HPI:  Matthew Vance is a 38 y.o. male here for EGD with possible limited enteroscopy to further evaluate abnormal small bowel seen at capsule.  Recent evaluation for melena right lower quadrant abdominal pain weight loss of diarrhea.  Past Medical History:  Diagnosis Date  . Anxiety   . Depression   . GERD (gastroesophageal reflux disease)   . Hypertension   . Migraine   . Pre-diabetes   . Renal disorder    kidney stone    Past Surgical History:  Procedure Laterality Date  . AGILE CAPSULE N/A 09/20/2018   Procedure: AGILE CAPSULE;  Surgeon: Daneil Dolin, MD;  Location: [facility];  Service: Endoscopy;  Laterality: N/A;  7:30am  . CHOLECYSTECTOMY  06/14/15   Dr. Karlyn Agee: path: cholelithiasis with chronic cholecystitis  . COLONOSCOPY  06/04/15   Dr. Doristine Mango: normal  . COLONOSCOPY WITH PROPOFOL N/A 09/26/2015   next tcs in 5 years for tubular adenoma. random colon bx negative. subtle muscosal changes of TI with negative bx, int hemorrhoids.   . CYSTOSCOPY WITH RETROGRADE PYELOGRAM, URETEROSCOPY AND STENT PLACEMENT Right 11/03/2017   Procedure: CYSTOSCOPY WITH RIGHT RETROGRADE PYELOGRAM, URETEROSCOPY AND STONE EXTRACTION;  Surgeon: Cleon Gustin, MD;  Location: [facility];  Service: Urology;  Laterality: Right;  . ESOPHAGOGASTRODUODENOSCOPY  06/27/15   Dr. Karlyn Agee: gastritis  . GIVENS CAPSULE STUDY N/A 09/28/2018   Procedure: GIVENS CAPSULE STUDY;  Surgeon: Daneil Dolin, MD;  Location: [facility];  Service: Endoscopy;  Laterality: N/A;  8:30am  . HAND SURGERY Right    trigger finger  . HERNIA REPAIR     umbilical hernia  . KNEE SURGERY Right    arthroscopy x4  . LYMPH NODE DISSECTION  2019   2 removed in left neck, "bonded together"  . partial parathyroidectomy with lymph node dissection  2019   benign    Prior to  Admission medications   Medication Sig Start Date End Date Taking? Authorizing Provider  acetaminophen (TYLENOL) 325 MG tablet Take 650 mg by mouth every 6 (six) hours as needed for moderate pain.   Yes [provider]  chlorthalidone (HYGROTON) 25 MG tablet Take 25 mg by mouth every evening.  10/18/17  Yes [provider]  diltiazem (TIAZAC) 360 MG 24 hr capsule Take 360 mg by mouth every evening.  10/18/17  Yes [provider]  metFORMIN (GLUCOPHAGE-XR) 500 MG 24 hr tablet Take 500 mg by mouth 2 (two) times daily. 08/18/18  Yes [provider]  omeprazole (PRILOSEC) 20 MG capsule Take 20 mg by mouth every evening.    Yes [provider]  propranolol ER (INDERAL LA) 120 MG 24 hr capsule TAKE ONE CAPSULE BY MOUTH DAILY Patient taking differently: Take 120 mg by mouth daily.  09/16/18  Yes Melvenia Beam, MD  sertraline (ZOLOFT) 100 MG tablet Take 200 mg by mouth at bedtime.    Yes [provider]  TRULICITY 0.45 TX/7.7SF SOPN Inject 0.75 mg as directed once a week. 08/18/18  Yes [provider]  ondansetron (ZOFRAN) 4 MG tablet Take 1 tablet (4 mg total) by mouth every 8 (eight) hours as needed for nausea or vomiting. Patient not taking: Reported on 10/13/2018 09/15/18   Kem Parkinson, PA-C  oxyCODONE-acetaminophen (PERCOCET/ROXICET) 5-325 MG tablet Take 1 tablet by mouth every 4 (four) hours as needed for  severe pain. Patient not taking: Reported on 10/13/2018 09/15/18   Kem Parkinson, PA-C  oxyCODONE-acetaminophen (PERCOCET/ROXICET) 5-325 MG tablet Take 1 tablet by mouth every 4 (four) hours as needed. Patient not taking: Reported on 10/13/2018 09/15/18   Kem Parkinson, PA-C    Allergies as of 10/13/2018 - Review Complete 10/13/2018  Allergen Reaction Noted  . Toradol [ketorolac tromethamine] Other (See Comments) 10/30/2015    Family History  Problem Relation Age of Onset  . Irritable bowel syndrome Sister   . Colitis Maternal  Aunt        had colostomy  . Irritable bowel syndrome Paternal Grandmother   . Crohn's disease Neg Hx   . Colon cancer Neg Hx   . Celiac disease Neg Hx   . Migraines Neg Hx     Social History   Socioeconomic History  . Marital status: Single    Spouse name: Not on file  . Number of children: 2  . Years of education: some college  . Highest education level: Not on file  Occupational History  . Occupation: city of [place]  . Financial resource strain: Not on file  . Food insecurity    Worry: Not on file    Inability: Not on file  . Transportation needs    Medical: Not on file    Non-medical: Not on file  Tobacco Use  . Smoking status: Never Smoker  . Smokeless tobacco: Current User    Types: Snuff  Substance and Sexual Activity  . Alcohol use: Yes    Alcohol/week: 0.0 standard drinks    Comment: rare  . Drug use: No  . Sexual activity: Yes    Birth control/protection: None  Lifestyle  . Physical activity    Days per week: Not on file    Minutes per session: Not on file  . Stress: Not on file  Relationships  . Social Herbalist on phone: Not on file    Gets together: Not on file    Attends religious service: Not on file    Active member of club or organization: Not on file    Attends meetings of clubs or organizations: Not on file    Relationship status: Not on file  . Intimate partner violence    Fear of current or ex partner: Not on file    Emotionally abused: Not on file    Physically abused: Not on file    Forced sexual activity: Not on file  Other Topics Concern  . Not on file  Social History Narrative   Lives with children   Caffeine- 1 20 oz daily    Review of Systems: See HPI, otherwise negative ROS  Physical Exam: BP 104/69   Pulse 80   Temp 98.2 F (36.8 C) (Oral)   Resp 16   Ht 6\' 4"  (1.93 m)   Wt (!) 158.8 kg   SpO2 95%   BMI 42.60 kg/m  General:   Alert,  Well-developed, well-nourished, pleasant and  cooperative in NAD Mouth:  No deformity or lesions. Neck:  Supple; no masses or thyromegaly. No significant cervical adenopathy. Lungs:  Clear throughout to auscultation.   No wheezes, crackles, or rhonchi. No acute distress. Heart:  Regular rate and rhythm; no murmurs, clicks, rubs,  or gallops. Abdomen: Non-distended, normal bowel sounds.  Soft and nontender without appreciable mass or hepatosplenomegaly.  Pulses:  Normal pulses noted. Extremities:  Without clubbing or edema.  Impression/ Plan:   38 year old gentleman  with abnormal small bowel on capsule study.  EGD with limited enteroscopy now being done hopefully reached abnormal area and obtain biopsies for histology.  The risks, benefits, limitations, alternatives and imponderables have been reviewed with the patient. Potential for esophageal dilation, biopsy, etc. have also been reviewed.  Questions have been answered. All parties agreeable.     Notice: This dictation was prepared with Dragon dictation along with smaller phrase technology. Any transcriptional errors that result from this process are unintentional and may not be corrected upon review.

## 2018-10-27 ENCOUNTER — Telehealth: Payer: Self-pay | Admitting: Nurse Practitioner

## 2018-10-27 ENCOUNTER — Encounter (HOSPITAL_COMMUNITY): Payer: Self-pay | Admitting: Internal Medicine

## 2018-10-27 DIAGNOSIS — R109 Unspecified abdominal pain: Secondary | ICD-10-CM

## 2018-10-27 DIAGNOSIS — R197 Diarrhea, unspecified: Secondary | ICD-10-CM

## 2018-10-27 DIAGNOSIS — R634 Abnormal weight loss: Secondary | ICD-10-CM

## 2018-10-27 NOTE — Telephone Encounter (Signed)
Called and spoke with Otila Kluver at [facility], she will call me back in the AM to let me know if they can pick up the CD tomorrow. She is aware that we close early on Fridays. Copy of CD is at the front desk for the pt to pick up.

## 2018-10-27 NOTE — Telephone Encounter (Signed)
Called [facility] and spoke to [facility], appt w/Dr.Fina scheduled for 11/30/18 at 9:00am at [address] [place]. Damion advised to mail Givens CD to [address]; [place], [place] [address]. Clinical notes faxed to [facility].  Called and informed pt of appt. He is aware to come by office to pick up Givens CD and records. He will try to pick up next week.

## 2018-10-27 NOTE — Telephone Encounter (Signed)
Please refer to Dr. Roney Mans at [facility] for further evaluation of abnormal small bowel capsule and consideration of push enteroscopy. Dx: Abdominal pain, diarrhea, weight loss. Will mail Givens study images along with last EGD and TCS, Givens report and most recent OV. Will have the patient pick up a copy of the above as well to bring with him to the visit.  Please obtain a specific mailing address for [place] to send CDs and documents to Dr. Roney Mans.

## 2018-10-28 NOTE — Telephone Encounter (Signed)
Josh from [facility] will be here this morning to pick up CD and take it to Dr.Fina's office per Otila Kluver at [facility].

## 2018-10-28 NOTE — Addendum Note (Signed)
Addended by: Hassan Rowan on: 10/28/2018 07:36 AM   Modules accepted: Orders

## 2018-12-08 ENCOUNTER — Encounter: Payer: Self-pay | Admitting: Internal Medicine

## 2018-12-08 ENCOUNTER — Encounter: Payer: Self-pay | Admitting: Gastroenterology

## 2018-12-08 ENCOUNTER — Ambulatory Visit: Payer: PRIVATE HEALTH INSURANCE | Admitting: Gastroenterology

## 2018-12-08 ENCOUNTER — Telehealth: Payer: Self-pay | Admitting: Internal Medicine

## 2018-12-08 NOTE — Telephone Encounter (Signed)
PATIENT WAS A NO SHOW AND LETTER SENT  °

## 2019-07-13 ENCOUNTER — Ambulatory Visit: Payer: PRIVATE HEALTH INSURANCE | Attending: Internal Medicine

## 2019-07-13 DIAGNOSIS — Z23 Encounter for immunization: Secondary | ICD-10-CM

## 2019-07-13 NOTE — Progress Notes (Signed)
   Covid-19 Vaccination Clinic  Name:  Matthew Vance    MRN: [id number] DOB: 1981/03/15  07/13/2019  Mr. Dooly was observed post Covid-19 immunization for 15 minutes without incident. He was provided with Vaccine Information Sheet and instruction to access the V-Safe system.   Mr. Mealey was instructed to call 911 with any severe reactions post vaccine: Marland Kitchen Difficulty breathing  . Swelling of face and throat  . A fast heartbeat  . A bad rash all over body  . Dizziness and weakness   Immunizations Administered    Name Date Dose VIS Date Route   Moderna COVID-19 Vaccine 07/13/2019  8:36 AM 0.5 mL 03/21/2019 Intramuscular   Manufacturer: Moderna   Lot: [id number]   [place]: 20601-561-53

## 2019-08-15 ENCOUNTER — Ambulatory Visit: Payer: PRIVATE HEALTH INSURANCE | Attending: Internal Medicine

## 2019-08-15 DIAGNOSIS — Z23 Encounter for immunization: Secondary | ICD-10-CM

## 2019-08-15 NOTE — Progress Notes (Signed)
   Covid-19 Vaccination Clinic  Name:  GLENVILLE ESPINA    MRN: [id number] DOB: [birth year]  08/15/2019  Mr. Deman was observed post Covid-19 immunization for 15 minutes without incident. He was provided with Vaccine Information Sheet and instruction to access the V-Safe system.   Mr. Belisle was instructed to call 911 with any severe reactions post vaccine: Marland Kitchen Difficulty breathing  . Swelling of face and throat  . A fast heartbeat  . A bad rash all over body  . Dizziness and weakness   Immunizations Administered    Name Date Dose VIS Date Route   Moderna COVID-19 Vaccine 08/15/2019  8:15 AM 0.5 mL 03/2019 Intramuscular   Manufacturer: Moderna   Lot: [id number]   [place]: [id number]

## 2019-08-16 ENCOUNTER — Other Ambulatory Visit (HOSPITAL_COMMUNITY): Payer: Self-pay | Admitting: General Practice

## 2019-08-16 ENCOUNTER — Other Ambulatory Visit: Payer: Self-pay

## 2019-08-16 ENCOUNTER — Ambulatory Visit (HOSPITAL_COMMUNITY)
Admission: RE | Admit: 2019-08-16 | Discharge: 2019-08-16 | Disposition: A | Discharge status: Home / Self Care | Payer: PRIVATE HEALTH INSURANCE | Source: Ambulatory Visit | Attending: General Practice | Admitting: General Practice

## 2019-08-16 DIAGNOSIS — M7989 Other specified soft tissue disorders: Secondary | ICD-10-CM | POA: Insufficient documentation

## 2019-08-16 DIAGNOSIS — M79662 Pain in left lower leg: Secondary | ICD-10-CM | POA: Insufficient documentation

## 2020-01-13 IMAGING — CT CT ABDOMEN AND PELVIS WITH CONTRAST
2 of 4 series · 17 of 46 positions shown, 19 images · IV contrast (Isovue)
Comparison: 04/26/2018

CLINICAL DATA: Abdominal pain.  Gastrointestinal bleeding.

EXAM:
CT ABDOMEN AND PELVIS WITH CONTRAST
TECHNIQUE: Multidetector CT imaging of the abdomen and pelvis was performed
using the standard protocol following bolus administration of
intravenous contrast.
CONTRAST:  100mL OMNIPAQUE IOHEXOL 300 MG/ML  SOLN

[Series 2: axial st · axial · 0.98mm/px · z∈[+932,+1387]mm · 14 of 106 slices shown, 16 images]
[im 8/106  soft-tissue]
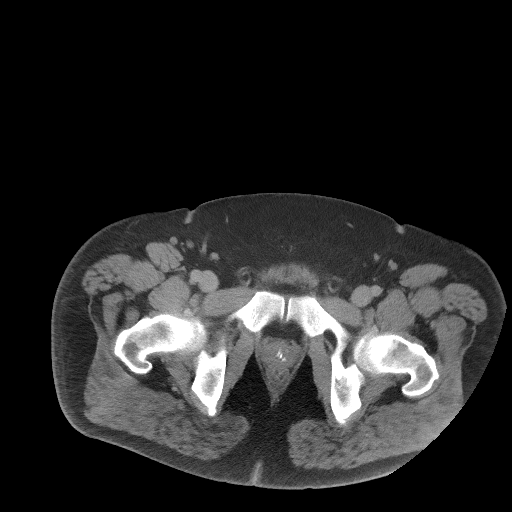
[im 8/106  bone]
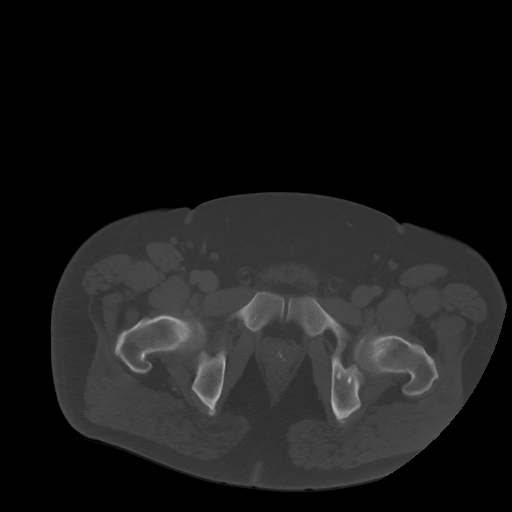
[im 15/106  soft-tissue]
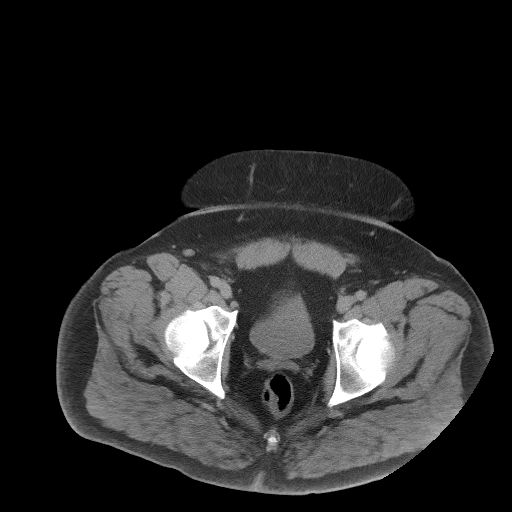
[im 22/106  soft-tissue]
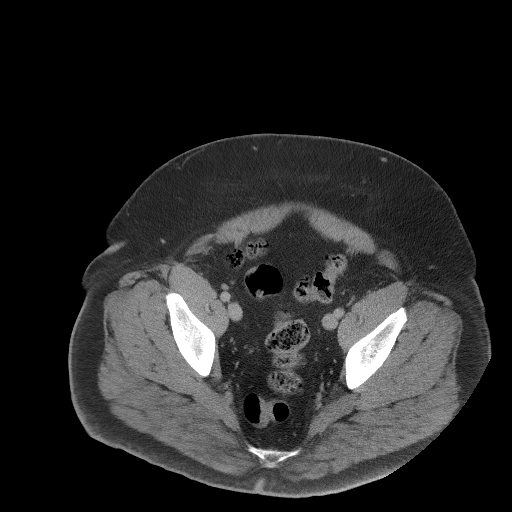
[im 29/106  soft-tissue]
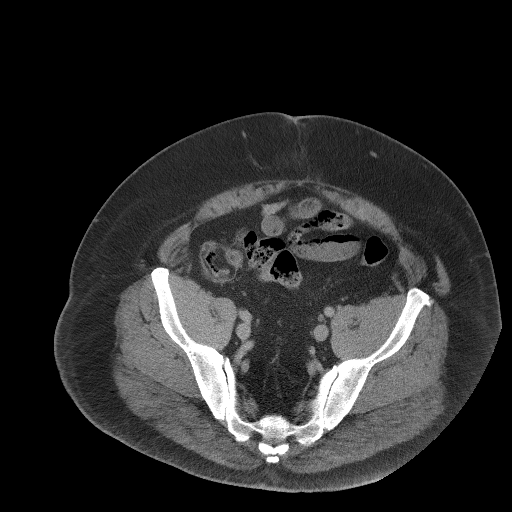
[im 36/106  soft-tissue]
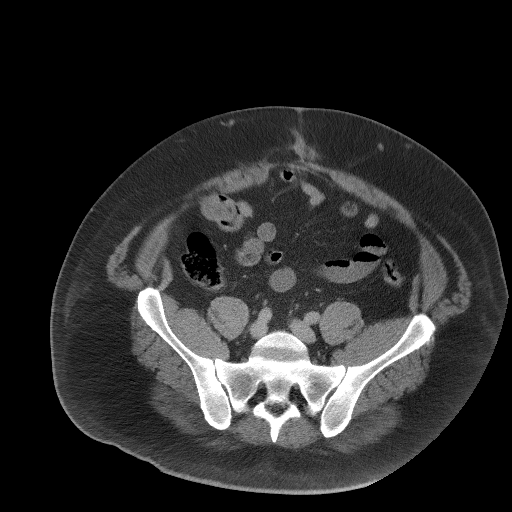
[im 43/106  soft-tissue]
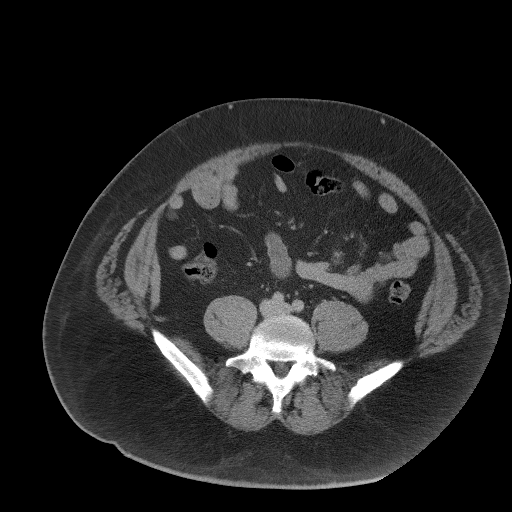
[im 50/106  soft-tissue]
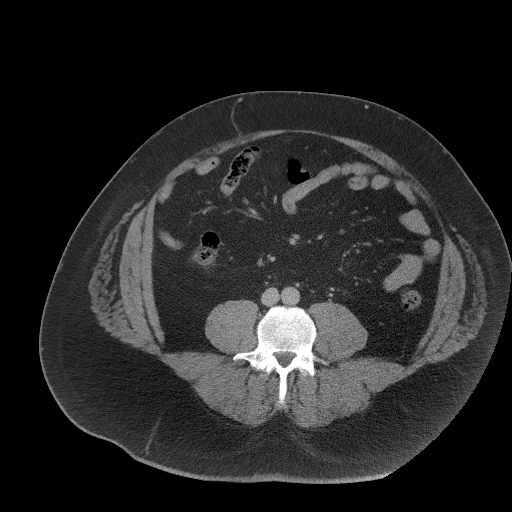
[im 57/106  soft-tissue]
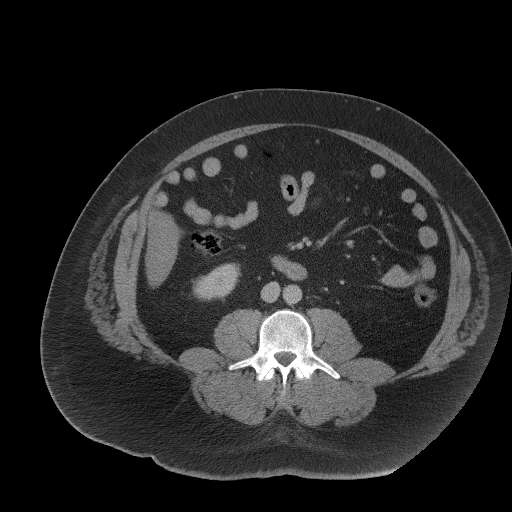
[im 64/106  soft-tissue]
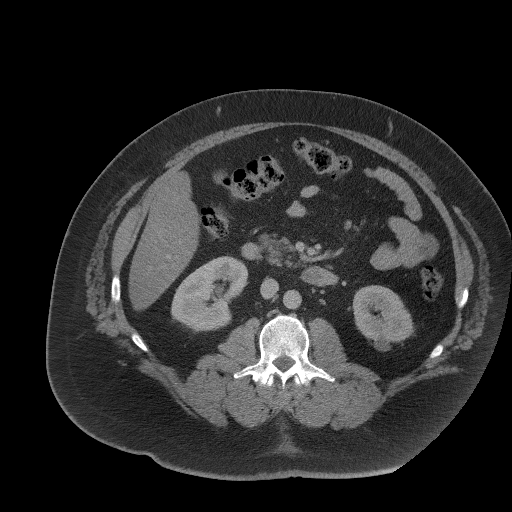
[im 64/106  bone]
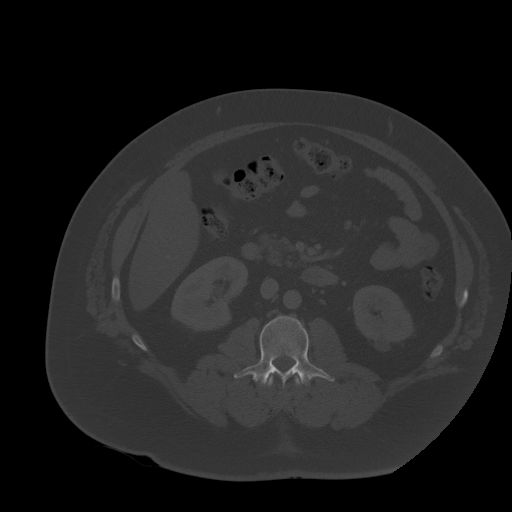
[im 71/106  soft-tissue]
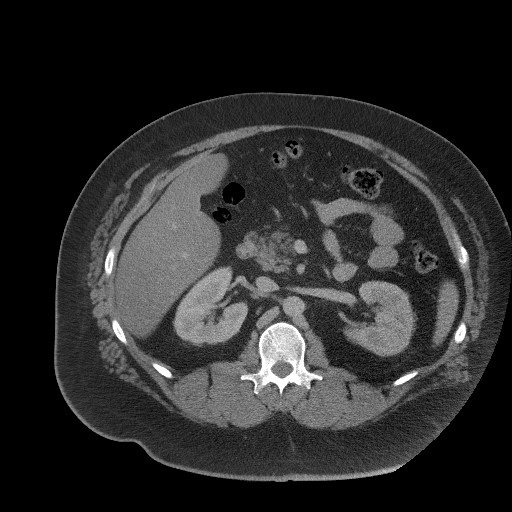
[im 78/106  soft-tissue]
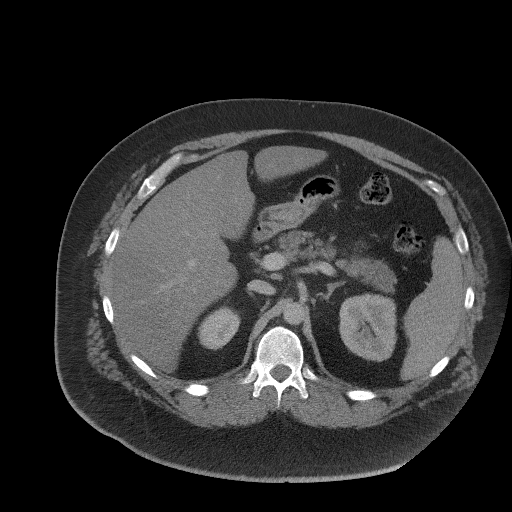
[im 85/106  soft-tissue]
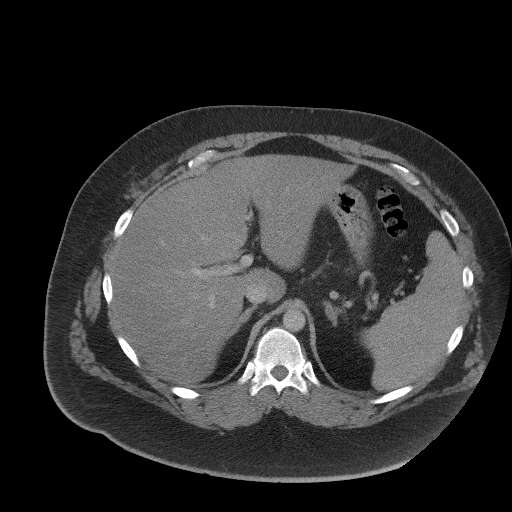
[im 92/106  soft-tissue]
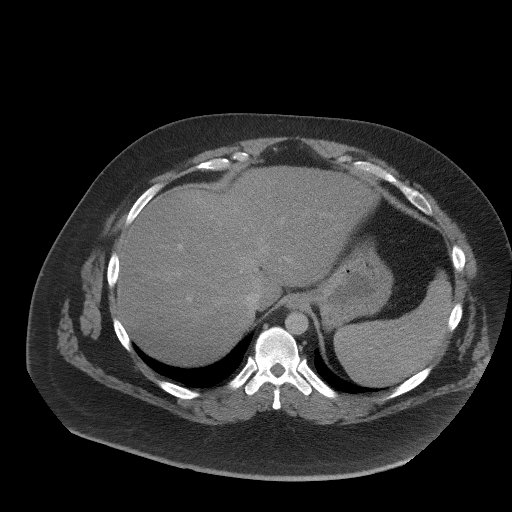
[im 99/106  soft-tissue]
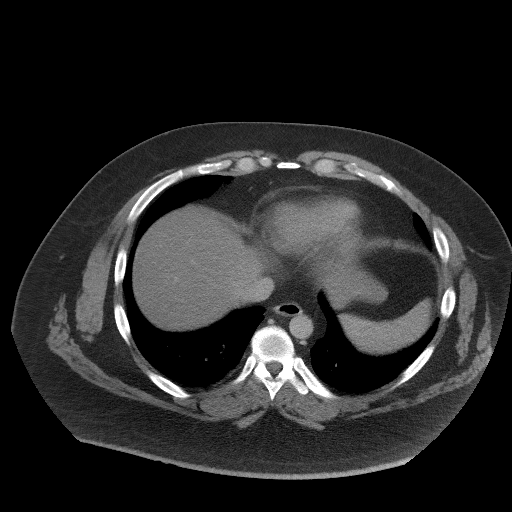

[Series 5: coronal st · coronal · 0.92mm/px · 3 of 117 slices shown]
[im 39/117  soft-tissue]
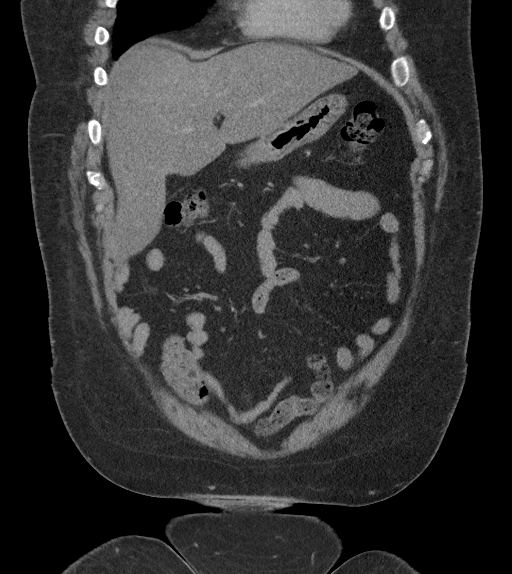
[im 52/117  soft-tissue]
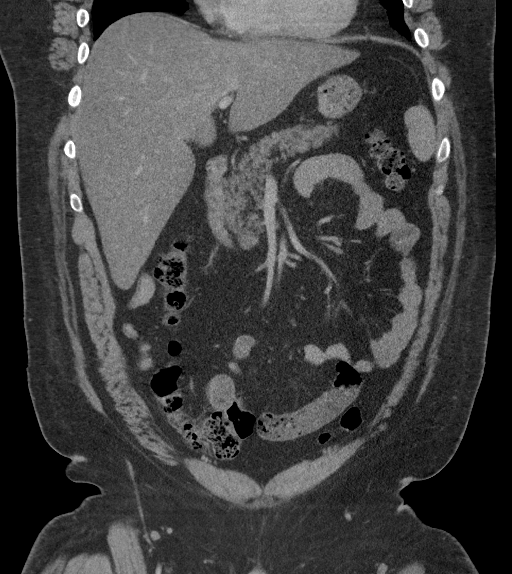
[im 65/117  soft-tissue]
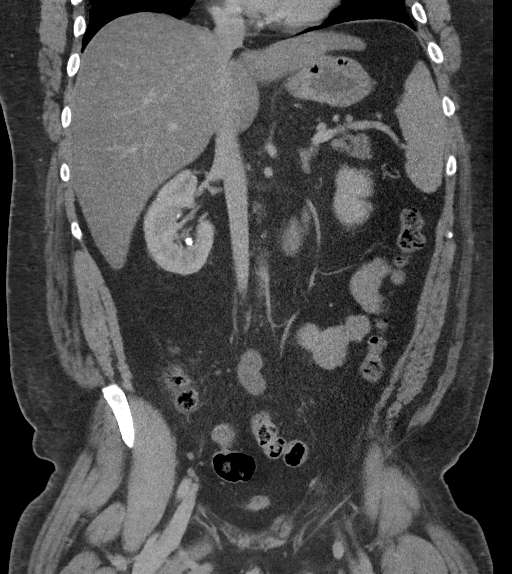

[17 of 46 positions shown; findings below may reference images not displayed]

FINDINGS: Lower chest: Normal

Hepatobiliary: Fatty change of the liver as seen previously.
Previous hysterectomy.

Pancreas: Normal

Spleen: Normal

Adrenals/Urinary Tract: Adrenal glands are normal. Left kidney is
normal except for a small cyst at the lower pole. Right kidney
contains a nonobstructing 7 mm stone in the lower pole. There is a 2
mm nonobstructing stone in the midportion. No passing stone. No
stone in the bladder.

Stomach/Bowel: No acute bowel pathology is seen. No ileus or
obstruction. No inflammatory changes. Again demonstrated is a right
sided umbilical region hernia containing a loop of small intestine
without evidence of obstruction or incarceration. This does not
appear changed from the previous study.

Vascular/Lymphatic: Normal

Reproductive: Normal

Other: No free fluid or air.

Musculoskeletal: Normal
IMPRESSION: No significant change since the previous study.

Steatosis of the liver.

Two nonobstructing right renal stones, the largest measuring 7 mm in
the lower pole.

Umbilical hernia containing fat and a small bit of small intestine.
No evidence of obstruction or incarceration.

## 2020-05-28 ENCOUNTER — Observation Stay (HOSPITAL_COMMUNITY)
Admission: EM | Admit: 2020-05-28 | Discharge: 2020-05-29 | Disposition: A | Discharge status: Home / Self Care | Payer: No Typology Code available for payment source | Attending: Surgery | Admitting: Surgery

## 2020-05-28 ENCOUNTER — Other Ambulatory Visit: Payer: Self-pay

## 2020-05-28 ENCOUNTER — Encounter (HOSPITAL_COMMUNITY): Payer: Self-pay | Admitting: Emergency Medicine

## 2020-05-28 DIAGNOSIS — F419 Anxiety disorder, unspecified: Secondary | ICD-10-CM | POA: Insufficient documentation

## 2020-05-28 DIAGNOSIS — I10 Essential (primary) hypertension: Secondary | ICD-10-CM | POA: Insufficient documentation

## 2020-05-28 DIAGNOSIS — K219 Gastro-esophageal reflux disease without esophagitis: Secondary | ICD-10-CM | POA: Insufficient documentation

## 2020-05-28 DIAGNOSIS — K429 Umbilical hernia without obstruction or gangrene: Secondary | ICD-10-CM | POA: Diagnosis present

## 2020-05-28 DIAGNOSIS — Z794 Long term (current) use of insulin: Secondary | ICD-10-CM | POA: Insufficient documentation

## 2020-05-28 DIAGNOSIS — Z79899 Other long term (current) drug therapy: Secondary | ICD-10-CM | POA: Insufficient documentation

## 2020-05-28 DIAGNOSIS — K420 Umbilical hernia with obstruction, without gangrene: Principal | ICD-10-CM | POA: Insufficient documentation

## 2020-05-28 DIAGNOSIS — Z20822 Contact with and (suspected) exposure to covid-19: Secondary | ICD-10-CM | POA: Diagnosis not present

## 2020-05-28 DIAGNOSIS — R1084 Generalized abdominal pain: Secondary | ICD-10-CM

## 2020-05-28 DIAGNOSIS — F329 Major depressive disorder, single episode, unspecified: Secondary | ICD-10-CM | POA: Diagnosis not present

## 2020-05-28 DIAGNOSIS — E119 Type 2 diabetes mellitus without complications: Secondary | ICD-10-CM | POA: Diagnosis not present

## 2020-05-28 DIAGNOSIS — G43909 Migraine, unspecified, not intractable, without status migrainosus: Secondary | ICD-10-CM | POA: Insufficient documentation

## 2020-05-28 DIAGNOSIS — R112 Nausea with vomiting, unspecified: Secondary | ICD-10-CM

## 2020-05-28 LAB — BASIC METABOLIC PANEL
Anion gap: 14 (ref 5–15)
BUN: 12 mg/dL (ref 6–20)
CO2: 22 mmol/L (ref 22–32)
Calcium: 9 mg/dL (ref 8.9–10.3)
Chloride: 97 mmol/L — ABNORMAL LOW (ref 98–111)
Creatinine, Ser: 0.75 mg/dL (ref 0.61–1.24)
GFR, Estimated: >60 mL/min (ref >60)
Glucose, Bld: 132 mg/dL — ABNORMAL HIGH (ref 70–99)
Potassium: 3.9 mmol/L (ref 3.5–5.1)
Sodium: 133 mmol/L — ABNORMAL LOW (ref 135–145)

## 2020-05-28 LAB — CBC
HCT: 42.7 % (ref 39.0–52.0)
Hemoglobin: 15.6 g/dL (ref 13.0–17.0)
MCH: 32 pg (ref 26.0–34.0)
MCHC: 36.5 g/dL — ABNORMAL HIGH (ref 30.0–36.0)
MCV: 87.5 fL (ref 80.0–100.0)
Platelets: 222 10*3/uL (ref 150–400)
RBC: 4.88 MIL/uL (ref 4.22–5.81)
RDW: 12.5 % (ref 11.5–15.5)
WBC: 8 10*3/uL (ref 4.0–10.5)
nRBC: 0 % (ref 0.0–0.2)

## 2020-05-28 LAB — CBC WITH DIFFERENTIAL/PLATELET
Abs Immature Granulocytes: 0.04 10*3/uL (ref 0.00–0.07)
Basophils Absolute: 0.1 10*3/uL (ref 0.0–0.1)
Basophils Relative: 1 %
Eosinophils Absolute: 0.1 10*3/uL (ref 0.0–0.5)
Eosinophils Relative: 1 %
HCT: 43.1 % (ref 39.0–52.0)
Hemoglobin: 15.7 g/dL (ref 13.0–17.0)
Immature Granulocytes: 1 %
Lymphocytes Relative: 26 %
Lymphs Abs: 2.2 10*3/uL (ref 0.7–4.0)
MCH: 31.6 pg (ref 26.0–34.0)
MCHC: 36.4 g/dL — ABNORMAL HIGH (ref 30.0–36.0)
MCV: 86.7 fL (ref 80.0–100.0)
Monocytes Absolute: 0.6 10*3/uL (ref 0.1–1.0)
Monocytes Relative: 7 %
Neutro Abs: 5.3 10*3/uL (ref 1.7–7.7)
Neutrophils Relative %: 64 %
Platelets: 235 10*3/uL (ref 150–400)
RBC: 4.97 MIL/uL (ref 4.22–5.81)
RDW: 12.5 % (ref 11.5–15.5)
WBC: 8.3 10*3/uL (ref 4.0–10.5)
nRBC: 0 % (ref 0.0–0.2)

## 2020-05-28 LAB — CBG MONITORING, ED: Glucose-Capillary: 128 mg/dL — ABNORMAL HIGH (ref 70–99)

## 2020-05-28 LAB — RESP PANEL BY RT-PCR (FLU A&B, COVID) ARPGX2
Influenza A by PCR: NEGATIVE
Influenza B by PCR: NEGATIVE
SARS Coronavirus 2 by RT PCR: NEGATIVE

## 2020-05-28 LAB — GLUCOSE, CAPILLARY: Glucose-Capillary: 123 mg/dL — ABNORMAL HIGH (ref 70–99)

## 2020-05-28 LAB — HIV ANTIBODY (ROUTINE TESTING W REFLEX): HIV Screen 4th Generation wRfx: NONREACTIVE

## 2020-05-28 LAB — HEMOGLOBIN A1C
Hgb A1c MFr Bld: 6.8 % — ABNORMAL HIGH (ref 4.8–5.6)
Mean Plasma Glucose: 148.46 mg/dL

## 2020-05-28 MED ORDER — METHOCARBAMOL 1000 MG/10ML IJ SOLN
500.0000 mg | Freq: Three times a day (TID) | INTRAVENOUS | Status: DC | PRN
  Filled 2020-05-28: qty 5

## 2020-05-28 MED ORDER — CEFAZOLIN SODIUM-DEXTROSE 2-4 GM/100ML-% IV SOLN
2.0000 g | Freq: Once | INTRAVENOUS | Status: AC
  Administered 2020-05-29: 2 g via INTRAVENOUS
  Filled 2020-05-28 (×2): qty 100

## 2020-05-28 MED ORDER — MORPHINE SULFATE (PF) 2 MG/ML IV SOLN
2.0000 mg | INTRAVENOUS | Status: DC | PRN
  Administered 2020-05-28 (×2): 4 mg via INTRAVENOUS
  Administered 2020-05-29: 2 mg via INTRAVENOUS
  Filled 2020-05-28: qty 2
  Filled 2020-05-28: qty 1
  Filled 2020-05-28: qty 2

## 2020-05-28 MED ORDER — ACETAMINOPHEN 325 MG PO TABS: 650.0000 mg | ORAL_TABLET | Freq: Four times a day (QID) | ORAL | Status: DC | PRN

## 2020-05-28 MED ORDER — ONDANSETRON 4 MG PO TBDP: 4.0000 mg | ORAL_TABLET | Freq: Four times a day (QID) | ORAL | Status: DC | PRN

## 2020-05-28 MED ORDER — DIPHENHYDRAMINE HCL 50 MG/ML IJ SOLN: 25.0000 mg | Freq: Four times a day (QID) | INTRAMUSCULAR | Status: DC | PRN

## 2020-05-28 MED ORDER — ONDANSETRON HCL 4 MG/2ML IJ SOLN: 4.0000 mg | Freq: Four times a day (QID) | INTRAMUSCULAR | Status: DC | PRN

## 2020-05-28 MED ORDER — DIPHENHYDRAMINE HCL 25 MG PO CAPS: 25.0000 mg | ORAL_CAPSULE | Freq: Four times a day (QID) | ORAL | Status: DC | PRN

## 2020-05-28 MED ORDER — ACETAMINOPHEN 500 MG PO TABS: 1000.0000 mg | ORAL_TABLET | Freq: Once | ORAL | Status: DC

## 2020-05-28 MED ORDER — INSULIN ASPART 100 UNIT/ML ~~LOC~~ SOLN
0.0000 [IU] | SUBCUTANEOUS | Status: DC
  Administered 2020-05-28 (×2): 2 [IU] via SUBCUTANEOUS
  Administered 2020-05-29: 5 [IU] via SUBCUTANEOUS
  Administered 2020-05-29 (×2): 2 [IU] via SUBCUTANEOUS
  Administered 2020-05-29: 5 [IU] via SUBCUTANEOUS

## 2020-05-28 MED ORDER — MUPIROCIN 2 % EX OINT
1.0000 "application " | TOPICAL_OINTMENT | Freq: Two times a day (BID) | CUTANEOUS | Status: DC
  Administered 2020-05-28: 1 via NASAL
  Filled 2020-05-28: qty 22

## 2020-05-28 MED ORDER — ONDANSETRON HCL 4 MG/2ML IJ SOLN
4.0000 mg | Freq: Once | INTRAMUSCULAR | Status: AC
  Administered 2020-05-28: 4 mg via INTRAVENOUS
  Filled 2020-05-28: qty 2

## 2020-05-28 MED ORDER — LACTATED RINGERS IV SOLN: INTRAVENOUS | Status: DC

## 2020-05-28 MED ORDER — MORPHINE SULFATE (PF) 4 MG/ML IV SOLN
4.0000 mg | Freq: Once | INTRAVENOUS | Status: AC
  Administered 2020-05-28: 4 mg via INTRAVENOUS
  Filled 2020-05-28: qty 1

## 2020-05-28 MED ORDER — METOPROLOL TARTRATE 5 MG/5ML IV SOLN: 5.0000 mg | Freq: Four times a day (QID) | INTRAVENOUS | Status: DC | PRN

## 2020-05-28 MED ORDER — PROMETHAZINE HCL 25 MG/ML IJ SOLN
12.5000 mg | Freq: Four times a day (QID) | INTRAMUSCULAR | Status: DC | PRN
  Administered 2020-05-28: 12.5 mg via INTRAVENOUS
  Filled 2020-05-28: qty 1

## 2020-05-28 MED ORDER — ACETAMINOPHEN 650 MG RE SUPP: 650.0000 mg | Freq: Four times a day (QID) | RECTAL | Status: DC | PRN

## 2020-05-28 MED ORDER — ENOXAPARIN SODIUM 40 MG/0.4ML ~~LOC~~ SOLN
40.0000 mg | SUBCUTANEOUS | Status: DC
  Administered 2020-05-28: 40 mg via SUBCUTANEOUS
  Filled 2020-05-28: qty 0.4

## 2020-05-28 NOTE — H&P (Signed)
Admission Note  Matthew Vance [birth year]  [id number].    Requesting MD: Dr. Theotis Burrow  Chief Complaint/Reason for Consult: umbilical hernia   HPI:  Patient is a 40 year old male who presented to [facility] earlier today with worsening pain at umbilical hernia site. Patient was seen at [facility] ED yesterday and CT scan showed recurrent umbilical hernia containing fat and small bowel but no evidence of strangulation at that time. He was discharged home and advised to follow up with his surgeon. Patient had a prior umbilical hernia repair ~5 years ago by Dr. Ladona Vance in [place], he thinks with mesh. He called their office today but was unable to get an appointment. He reports pain in central abdomen started about 1 week ago and has progressively worsened. Associated with multiple episodes of n/v. He had 1 BM yesterday, no flatus. Denies fever, chills, chest pain, SOB.   PMH significant for HTN, GERD, DM, migraines, depression/anxiety. Other past abdominal surgery includes laparoscopic cholecystectomy. He is not on any blood thinning medications. Patient has allergy to toradol with a reaction of hives/ throat swelling. Patient reports rare alcohol use and denies tobacco or illicit drug use. He works for [employer].  ROS: Review of Systems  Constitutional: Negative for chills and fever.  Respiratory: Negative for shortness of breath and wheezing.   Cardiovascular: Negative for chest pain and palpitations.  Gastrointestinal: Positive for abdominal pain, constipation, diarrhea, nausea and vomiting.  All other systems reviewed and are negative.   Family History  Problem Relation Age of Onset  . Irritable bowel syndrome Sister   . Colitis Maternal Aunt        had colostomy  . Irritable bowel syndrome Paternal Grandmother   . Crohn's disease Neg Hx   . Colon cancer Neg Hx   . Celiac disease Neg Hx   . Migraines Neg Hx     Past Medical History:  Diagnosis Date  . Anxiety    . Depression   . GERD (gastroesophageal reflux disease)   . Hypertension   . Migraine   . Pre-diabetes   . Renal disorder    kidney stone    Past Surgical History:  Procedure Laterality Date  . AGILE CAPSULE N/A 09/20/2018   Procedure: AGILE CAPSULE;  Surgeon: Daneil Dolin, MD;  [facility];  Service: Endoscopy;  Laterality: N/A;  7:30am  . BIOPSY  10/20/2018   Procedure: BIOPSY;  Surgeon: Daneil Dolin, MD;  Location: [facility];  Service: Endoscopy;;  small bowel  . CHOLECYSTECTOMY  06/14/15   Dr. Karlyn Agee: path: cholelithiasis with chronic cholecystitis  . COLONOSCOPY  06/04/15   Dr. Doristine Mango: normal  . COLONOSCOPY WITH PROPOFOL N/A 09/26/2015   next tcs in 5 years for tubular adenoma. random colon bx negative. subtle muscosal changes of TI with negative bx, int hemorrhoids.   . CYSTOSCOPY WITH RETROGRADE PYELOGRAM, URETEROSCOPY AND STENT PLACEMENT Right 11/03/2017   Procedure: CYSTOSCOPY WITH RIGHT RETROGRADE PYELOGRAM, URETEROSCOPY AND STONE EXTRACTION;  Surgeon: Cleon Gustin, MD;  Location: [facility] ORS;  Service: Urology;  Laterality: Right;  . ENTEROSCOPY N/A 10/20/2018   Procedure: ENTEROSCOPY WITH PROPOFOL;  Surgeon: Daneil Dolin, MD;  Location[facility];  Service: Endoscopy;  Laterality: N/A;  +/- limited push enteroscopy with the pediatric colonoscope   . ESOPHAGOGASTRODUODENOSCOPY  06/27/15   Dr. Karlyn Agee: gastritis  . ESOPHAGOGASTRODUODENOSCOPY (EGD) WITH PROPOFOL N/A 10/20/2018   Dr. Gala Vance: Diamantina Monks  hiatal hernia, multiple diminutive appearing gastric polyps not manipulated, duodenum, proximal jejunum status post multiple biopsies which were benign.  Abnormality seen on capsule in study question artifact versus not reached  . GIVENS CAPSULE STUDY N/A 09/28/2018   Dr. Gala Vance: Abnormal more proximal small bowel, images appear to depict for strain/fracture of the mucosa.  Enteroscopy with biopsy warranted.  Matthew Vance HAND SURGERY Right    trigger  finger  . HERNIA REPAIR     umbilical hernia  . KNEE SURGERY Right    arthroscopy x4  . LYMPH NODE DISSECTION  2019   2 removed in left neck, "bonded together"  . partial parathyroidectomy with lymph node dissection  2019   benign    Social History:  reports that he has never smoked. His smokeless tobacco use includes snuff. He reports current alcohol use. He reports that he does not use drugs.  Allergies:  Allergies  Allergen Reactions  . Toradol [Ketorolac Tromethamine] Other (See Comments)    Chest pain    (Not in a hospital admission)   Blood pressure (!) 169/100, pulse 81, temperature 97.8 F (36.6 C), temperature source Oral, resp. rate (!) 22, height 6' 4"  (1.93 m), weight (!) 149.7 kg, SpO2 99 %. Physical Exam:  General: pleasant, WD, obese male who is laying in bed in NAD HEENT: head is normocephalic, atraumatic.  Sclera are noninjected.  PERRL.  Ears and nose without any masses or lesions.  Mouth is pink and moist Heart: regular, rate, and rhythm.  Normal s1,s2. No obvious murmurs, gallops, or rubs noted.  Palpable radial and pedal pulses bilaterally Lungs: CTAB, no wheezes, rhonchi, or rales noted.  Respiratory effort nonlabored Abd: soft, +BS, umbilical hernia not fully reducible/ no overlying skin changes, focally tender at hernia site, no peritonitis  MS: all 4 extremities are symmetrical with no cyanosis, clubbing, or edema. Skin: warm and dry with no masses, lesions, or rashes Neuro: Cranial nerves 2-12 grossly intact, speech is normal Psych: A&Ox3 with an appropriate affect.   No results found for this or any previous visit (from the past 48 hour(s)). No results found.    Assessment/Plan HTN GERD DM Migraines Depression/Anxiety  Recurrent umbilical hernia containing fat and small bowel - CT from [facility] 2/7 shows 3 cm defect containing lobulated fat and small bowel. Patient was discharged from ED yesterday and instructed to follow up with surgeon (Dr.  Ladona Vance) but he was unable to get an appointment quickly and had worsening pain. - With persistent abdominal pain, nausea, and vomiting, will admit to observation and plan for surgery tomorrow: Laparoscopic vs open ventral hernia repair with possible mesh. Covid test is pending.  FEN - IVF, NPO/NGT VTE - SCDs, lovenox ID - none Foley - none Follow up - TBD  Margie Billet, [facility] 05/28/2020, 2:04 PM Please see Amion for pager number during day hours 7:00am-4:30pm

## 2020-05-28 NOTE — ED Triage Notes (Signed)
Pt reports hx of abd hernia, repaired in the past, reports worsening pain at site of hernia. Went to [facility] last night and was dc'ed, states he spoke with general surgery who advised him to come here. CT results from [facility] visit show fat and small bowel contained in hernia.

## 2020-05-28 NOTE — Anesthesia Preprocedure Evaluation (Addendum)
Anesthesia Evaluation  Patient identified by MRN, date of birth, ID band Patient awake    Reviewed: Allergy & Precautions, NPO status , Patient's Chart, lab work & pertinent test results  History of Anesthesia Complications Negative for: history of anesthetic complications  Airway Mallampati: II  TM Distance: >3 FB Neck ROM: Full    Dental  (+) Missing,    Pulmonary neg pulmonary ROS,    Pulmonary exam normal        Cardiovascular hypertension, Pt. on medications Normal cardiovascular exam     Neuro/Psych Anxiety Depression negative neurological ROS     GI/Hepatic Neg liver ROS, GERD  Controlled and Medicated,Umbilical hernia   Endo/Other  diabetes, Type 2, Oral Hypoglycemic AgentsMorbid obesity  Renal/GU negative Renal ROS  negative genitourinary   Musculoskeletal negative musculoskeletal ROS (+)   Abdominal   Peds  Hematology negative hematology ROS (+)   Anesthesia Other Findings Day of surgery medications reviewed with patient.  Reproductive/Obstetrics negative OB ROS                            Anesthesia Physical Anesthesia Plan  ASA: III  Anesthesia Plan: General   Post-op Pain Management:    Induction: Intravenous  PONV Risk Score and Plan: 4 or greater and Treatment may vary due to age or medical condition, Ondansetron, Dexamethasone and Midazolam  Airway Management Planned: Oral ETT  Additional Equipment: None  Intra-op Plan:   Post-operative Plan: Extubation in OR  Informed Consent: I have reviewed the patients History and Physical, chart, labs and discussed the procedure including the risks, benefits and alternatives for the proposed anesthesia with the patient or authorized representative who has indicated his/her understanding and acceptance.     Dental advisory given  Plan Discussed with: CRNA  Anesthesia Plan Comments:        Anesthesia Quick  Evaluation

## 2020-05-28 NOTE — ED Provider Notes (Signed)
[facility] EMERGENCY DEPARTMENT Provider Note   CSN: [id number] Arrival date & time: 05/28/20  1127     History Chief Complaint  Patient presents with  . Hernia    Matthew Vance is a 39 y.o. male who presents for evaluation of abdominal pain.  He reports that for about a week or so, he has had worsening abdominal pain, mostly in the upper abdomen.  He has had some additional nausea/vomiting as well as diarrhea.  He states last night, he went to [facility] for evaluation of symptoms.  He was evaluated and had a CT scan that showed complex lobular fat and bowel containing ventral hernia with what appears to be postsurgical changes from prior hernia repair.  Patient reports he was discharged home last night.  He was told to follow-up with his surgeon Dr. Ladona Horns [facility]).  He called this morning and was not able to get an appoint with him today.  He states that he was continuing to have pain and so they recommended that he contact the surgical center.  He states that they were told to come to the emergency department for further evaluation.  He states he has continued to have pain.  He has had nausea/vomiting.  Denies any fevers.  Denies any chest pain, difficulty breathing.  The history is provided by the patient.       Past Medical History:  Diagnosis Date  . Anxiety   . Depression   . GERD (gastroesophageal reflux disease)   . Hypertension   . Migraine   . Pre-diabetes   . Renal disorder    kidney stone    Patient Active Problem List   Diagnosis Date Noted  . Umbilical hernia 19/41/7408  . Hepatic steatosis 09/07/2018  . Melena 09/07/2018  . Migraine with aura and without status migrainosus 10/13/2017  . 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 RLQ abdominal pain 10/11/2015  . History of colonic polyps   . Abnormal computed tomography of cecum and terminal ileum   . Abnormal weight loss 09/11/2015  . Rectal bleeding 09/11/2015  . Abdominal pain 06/22/2013   . Hyponatremia 06/22/2013  . Hyperglycemia 06/22/2013  . Nausea with vomiting 06/22/2013  . Hematuria, microscopic 06/22/2013  . Diarrhea 06/22/2013    Past Surgical History:  Procedure Laterality Date  . AGILE CAPSULE N/A 09/20/2018   Procedure: AGILE CAPSULE;  Surgeon: Daneil Dolin, MD;  [facility];  Service: Endoscopy;  Laterality: N/A;  7:30am  . BIOPSY  10/20/2018   Procedure: BIOPSY;  Surgeon: Daneil Dolin, MD;  [facility];  Service: Endoscopy;;  small bowel  . CHOLECYSTECTOMY  06/14/15   Dr. Karlyn Agee: path: cholelithiasis with chronic cholecystitis  . COLONOSCOPY  06/04/15   Dr. Doristine Mango: normal  . COLONOSCOPY WITH PROPOFOL N/A 09/26/2015   next tcs in 5 years for tubular adenoma. random colon bx negative. subtle muscosal changes of TI with negative bx, int hemorrhoids.   . CYSTOSCOPY WITH RETROGRADE PYELOGRAM, URETEROSCOPY AND STENT PLACEMENT Right 11/03/2017   Procedure: CYSTOSCOPY WITH RIGHT RETROGRADE PYELOGRAM, URETEROSCOPY AND STONE EXTRACTION;  Surgeon: Cleon Gustin, MD;  Location: [facility] ORS;  Service: Urology;  Laterality: Right;  . ENTEROSCOPY N/A 10/20/2018   Procedure: ENTEROSCOPY WITH PROPOFOL;  Surgeon: Daneil Dolin, MD;  [facility];  Service: Endoscopy;  Laterality: N/A;  +/- limited push enteroscopy with the pediatric colonoscope   . ESOPHAGOGASTRODUODENOSCOPY  06/27/15   Dr. Karlyn Agee: gastritis  .  ESOPHAGOGASTRODUODENOSCOPY (EGD) WITH PROPOFOL N/A 10/20/2018   Dr. Gala Romney: Small hiatal hernia, multiple diminutive appearing gastric polyps not manipulated, duodenum, proximal jejunum status post multiple biopsies which were benign.  Abnormality seen on capsule in study question artifact versus not reached  . GIVENS CAPSULE STUDY N/A 09/28/2018   Dr. Gala Romney: Abnormal more proximal small bowel, images appear to depict for strain/fracture of the mucosa.  Enteroscopy with biopsy warranted.  Marland Kitchen HAND SURGERY Right     trigger finger  . HERNIA REPAIR     umbilical hernia  . KNEE SURGERY Right    arthroscopy x4  . LYMPH NODE DISSECTION  2019   2 removed in left neck, "bonded together"  . partial parathyroidectomy with lymph node dissection  2019   benign       Family History  Problem Relation Age of Onset  . Irritable bowel syndrome Sister   . Colitis Maternal Aunt        had colostomy  . Irritable bowel syndrome Paternal Grandmother   . Crohn's disease Neg Hx   . Colon cancer Neg Hx   . Celiac disease Neg Hx   . Migraines Neg Hx     Social History   Tobacco Use  . Smoking status: Never Smoker  . Smokeless tobacco: Current User    Types: Snuff  Vaping Use  . Vaping Use: Never used  Substance Use Topics  . Alcohol use: Yes    Alcohol/week: 0.0 standard drinks    Comment: rare  . Drug use: No    Home Medications Prior to Admission medications   Medication Sig Start Date End Date Taking? Authorizing Provider  acetaminophen (TYLENOL) 325 MG tablet Take 650 mg by mouth every 6 (six) hours as needed for moderate pain.   Yes [provider]  chlorthalidone (HYGROTON) 25 MG tablet Take 25 mg by mouth every evening.  10/18/17  Yes [provider]  diltiazem (TIAZAC) 360 MG 24 hr capsule Take 360 mg by mouth every evening.  10/18/17  Yes [provider]  metFORMIN (GLUCOPHAGE-XR) 500 MG 24 hr tablet Take 500 mg by mouth 2 (two) times daily. 08/18/18  Yes [provider]  omeprazole (PRILOSEC) 20 MG capsule Take 20 mg by mouth every evening.    Yes [provider]  propranolol ER (INDERAL LA) 120 MG 24 hr capsule TAKE ONE CAPSULE BY MOUTH DAILY Patient taking differently: Take 120 mg by mouth daily. 09/16/18  Yes Melvenia Beam, MD  sertraline (ZOLOFT) 100 MG tablet Take 200 mg by mouth at bedtime.    Yes [provider]  TRULICITY 6.01 UX/3.2TF SOPN Inject 0.75 mg as directed every Monday. 08/18/18  Yes [provider]     Allergies    Toradol [ketorolac tromethamine]  Review of Systems   Review of Systems  Constitutional: Negative for fever.  Respiratory: Negative for cough and shortness of breath.   Cardiovascular: Negative for chest pain.  Gastrointestinal: Positive for abdominal pain, nausea and vomiting.  Genitourinary: Negative for dysuria and hematuria.  Neurological: Negative for headaches.  All other systems reviewed and are negative.   Physical Exam Updated Vital Signs BP 121/84   Pulse 76   Temp 97.8 F (36.6 C) (Oral)   Resp 18   Ht 6' 4"  (1.93 m)   Wt (!) 149.7 kg   SpO2 96%   BMI 40.17 kg/m   Physical Exam Vitals and nursing note reviewed.  Constitutional:      Appearance: Normal appearance.  He is well-developed and well-nourished.  HENT:     Head: Normocephalic and atraumatic.     Mouth/Throat:     Mouth: Oropharynx is clear and moist and mucous membranes are normal.  Eyes:     General: Lids are normal.     Extraocular Movements: EOM normal.     Conjunctiva/sclera: Conjunctivae normal.     Pupils: Pupils are equal, round, and reactive to light.  Cardiovascular:     Rate and Rhythm: Normal rate and regular rhythm.     Pulses: Normal pulses.     Heart sounds: Normal heart sounds. No murmur heard. No friction rub. No gallop.   Pulmonary:     Effort: Pulmonary effort is normal.     Breath sounds: Normal breath sounds.  Abdominal:     Palpations: Abdomen is soft. Abdomen is not rigid.     Tenderness: There is generalized abdominal tenderness. There is no guarding.     Comments: Abdomen is soft, nondistended.  Generalized tenderness, slightly worse in the mid/left upper abdomen.  Questionable palpable hernia noted periumbilically.  No overlying warmth, erythema, edema.  Musculoskeletal:        General: Normal range of motion.     Cervical back: Full passive range of motion without pain.  Skin:    General: Skin is warm and dry.     Capillary Refill: Capillary  refill takes less than 2 seconds.  Neurological:     Mental Status: He is alert and oriented to person, place, and time.  Psychiatric:        Mood and Affect: Mood and affect normal.        Speech: Speech normal.     ED Results / Procedures / Treatments   Labs (all labs ordered are listed, but only abnormal results are displayed) Labs Reviewed  CBC WITH DIFFERENTIAL/PLATELET - Abnormal; Notable for the following components:      Result Value   MCHC 36.4 (*)    All other components within normal limits  RESP PANEL BY RT-PCR (FLU A&B, COVID) ARPGX2  BASIC METABOLIC PANEL  HIV ANTIBODY (ROUTINE TESTING W REFLEX)  CBC  CREATININE, SERUM    EKG None  Radiology No results found.  Procedures Procedures   Medications Ordered in ED Medications  enoxaparin (LOVENOX) injection 40 mg (has no administration in time range)  lactated ringers infusion (has no administration in time range)  ceFAZolin (ANCEF) IVPB 2g/100 mL premix (has no administration in time range)  acetaminophen (TYLENOL) tablet 650 mg (has no administration in time range)    Or  acetaminophen (TYLENOL) suppository 650 mg (has no administration in time range)  morphine 2 MG/ML injection 2-4 mg (has no administration in time range)  diphenhydrAMINE (BENADRYL) capsule 25 mg (has no administration in time range)    Or  diphenhydrAMINE (BENADRYL) injection 25 mg (has no administration in time range)  ondansetron (ZOFRAN-ODT) disintegrating tablet 4 mg (has no administration in time range)    Or  ondansetron (ZOFRAN) injection 4 mg (has no administration in time range)  metoprolol tartrate (LOPRESSOR) injection 5 mg (has no administration in time range)  methocarbamol (ROBAXIN) 500 mg in dextrose 5 % 50 mL IVPB (has no administration in time range)  ondansetron (ZOFRAN) injection 4 mg (4 mg Intravenous Given 05/28/20 1414)  morphine 4 MG/ML injection 4 mg (4 mg Intravenous Given 05/28/20 1415)    ED Course  I have  reviewed the triage vital signs and the nursing notes.  Pertinent labs &  imaging results that were available during my care of the patient were reviewed by me and considered in my medical decision making (see chart for details).    MDM Rules/Calculators/A&P                          40 year old male who presents for evaluation of abdominal pain.  Was seen last night at [facility] and shown to have a CT scan that showed a hernia.  He was discharged home.  He attempted to follow-up with outpatient surgery but was unable to be seen till tomorrow.  Reports worsening pain, nausea/vomiting at home.  He reports that his mom discussed with one of the nurses at [facility] and states that they were told to come to the emergency department for further evaluation. On initial arrival, he is afebrile, appears uncomfortable.  He has diffuse abdominal tenderness noted.  No rigidity, guarding.  We will plan to check basic labs and consult surgery.  Discussed patient with Jerene Pitch (Gen Surg PA). They will come to the ED and evaluate the patient. Patient does not need repeat CT scan at this time.   Discussed with Jerene Pitch (general surgery).  Patient will be admitted for repair.  Portions of this note were generated with [employer]. Dictation errors may occur despite best attempts at proofreading.  Final Clinical Impression(s) / ED Diagnoses Final diagnoses:  Generalized abdominal pain  Nausea and vomiting, intractability of vomiting not specified, unspecified vomiting type    Rx / DC Orders ED Discharge Orders    None       Desma Mcgregor 05/28/20 1454    Little, Wenda Overland, MD 06/03/20 [phone]

## 2020-05-29 ENCOUNTER — Observation Stay (HOSPITAL_COMMUNITY): Payer: No Typology Code available for payment source | Admitting: Anesthesiology

## 2020-05-29 ENCOUNTER — Encounter (HOSPITAL_COMMUNITY)
Admission: EM | Disposition: A | Discharge status: Home / Self Care | Payer: Self-pay | Source: Home / Self Care | Attending: Emergency Medicine

## 2020-05-29 HISTORY — PX: UMBILICAL HERNIA REPAIR: SHX196

## 2020-05-29 LAB — BASIC METABOLIC PANEL
Anion gap: 10 (ref 5–15)
BUN: 13 mg/dL (ref 6–20)
CO2: 27 mmol/L (ref 22–32)
Calcium: 8.7 mg/dL — ABNORMAL LOW (ref 8.9–10.3)
Chloride: 97 mmol/L — ABNORMAL LOW (ref 98–111)
Creatinine, Ser: 0.91 mg/dL (ref 0.61–1.24)
GFR, Estimated: >60 mL/min (ref >60)
Glucose, Bld: 127 mg/dL — ABNORMAL HIGH (ref 70–99)
Potassium: 3.9 mmol/L (ref 3.5–5.1)
Sodium: 134 mmol/L — ABNORMAL LOW (ref 135–145)

## 2020-05-29 LAB — SURGICAL PCR SCREEN
MRSA, PCR: NEGATIVE
Staphylococcus aureus: NEGATIVE

## 2020-05-29 LAB — GLUCOSE, CAPILLARY
Glucose-Capillary: 124 mg/dL — ABNORMAL HIGH (ref 70–99)
Glucose-Capillary: 131 mg/dL — ABNORMAL HIGH (ref 70–99)
Glucose-Capillary: 140 mg/dL — ABNORMAL HIGH (ref 70–99)
Glucose-Capillary: 255 mg/dL — ABNORMAL HIGH (ref 70–99)
Glucose-Capillary: 226 mg/dL — ABNORMAL HIGH (ref 70–99)

## 2020-05-29 LAB — CBC
HCT: 40.8 % (ref 39.0–52.0)
Hemoglobin: 14 g/dL (ref 13.0–17.0)
MCH: 31 pg (ref 26.0–34.0)
MCHC: 34.3 g/dL (ref 30.0–36.0)
MCV: 90.3 fL (ref 80.0–100.0)
Platelets: 188 10*3/uL (ref 150–400)
RBC: 4.52 MIL/uL (ref 4.22–5.81)
RDW: 12.6 % (ref 11.5–15.5)
WBC: 7.7 10*3/uL (ref 4.0–10.5)
nRBC: 0 % (ref 0.0–0.2)

## 2020-05-29 SURGERY — REPAIR, HERNIA, UMBILICAL, LAPAROSCOPIC
Anesthesia: General | Site: Abdomen

## 2020-05-29 MED ORDER — SUGAMMADEX SODIUM 200 MG/2ML IV SOLN
INTRAVENOUS | Status: DC | PRN
  Administered 2020-05-29: 300 mg via INTRAVENOUS

## 2020-05-29 MED ORDER — OXYCODONE HCL 5 MG/5ML PO SOLN: 5.0000 mg | ORAL | Status: DC | PRN

## 2020-05-29 MED ORDER — FENTANYL CITRATE (PF) 250 MCG/5ML IJ SOLN
INTRAMUSCULAR | Status: AC
  Filled 2020-05-29: qty 5

## 2020-05-29 MED ORDER — ROCURONIUM BROMIDE 10 MG/ML (PF) SYRINGE
PREFILLED_SYRINGE | INTRAVENOUS | Status: AC
  Filled 2020-05-29: qty 10

## 2020-05-29 MED ORDER — DEXAMETHASONE SODIUM PHOSPHATE 10 MG/ML IJ SOLN
INTRAMUSCULAR | Status: AC
  Filled 2020-05-29: qty 1

## 2020-05-29 MED ORDER — LIDOCAINE 2% (20 MG/ML) 5 ML SYRINGE
INTRAMUSCULAR | Status: DC | PRN
  Administered 2020-05-29: 100 mg via INTRAVENOUS

## 2020-05-29 MED ORDER — ACETAMINOPHEN 500 MG PO TABS
1000.0000 mg | ORAL_TABLET | Freq: Four times a day (QID) | ORAL | Status: DC
  Administered 2020-05-29: 1000 mg via ORAL
  Filled 2020-05-29: qty 2

## 2020-05-29 MED ORDER — MIDAZOLAM HCL 2 MG/2ML IJ SOLN
INTRAMUSCULAR | Status: DC | PRN
  Administered 2020-05-29: 2 mg via INTRAVENOUS

## 2020-05-29 MED ORDER — 0.9 % SODIUM CHLORIDE (POUR BTL) OPTIME
TOPICAL | Status: DC | PRN
  Administered 2020-05-29: 1000 mL

## 2020-05-29 MED ORDER — CEFAZOLIN SODIUM-DEXTROSE 1-4 GM/50ML-% IV SOLN
INTRAVENOUS | Status: DC | PRN
  Administered 2020-05-29: 1 g via INTRAVENOUS

## 2020-05-29 MED ORDER — FENTANYL CITRATE (PF) 100 MCG/2ML IJ SOLN
25.0000 ug | INTRAMUSCULAR | Status: DC | PRN
  Administered 2020-05-29 (×2): 50 ug via INTRAVENOUS

## 2020-05-29 MED ORDER — ONDANSETRON 4 MG PO TBDP: 4.0000 mg | ORAL_TABLET | Freq: Four times a day (QID) | ORAL | 0 refills | Status: AC | PRN

## 2020-05-29 MED ORDER — OXYCODONE HCL 5 MG PO TABS
5.0000 mg | ORAL_TABLET | ORAL | Status: DC | PRN
  Administered 2020-05-29: 10 mg via ORAL
  Filled 2020-05-29: qty 2

## 2020-05-29 MED ORDER — OXYCODONE HCL 5 MG PO TABS
ORAL_TABLET | ORAL | Status: AC
  Filled 2020-05-29: qty 1

## 2020-05-29 MED ORDER — ONDANSETRON HCL 4 MG/2ML IJ SOLN
INTRAMUSCULAR | Status: DC | PRN
  Administered 2020-05-29: 4 mg via INTRAVENOUS

## 2020-05-29 MED ORDER — PROPOFOL 10 MG/ML IV BOLUS
INTRAVENOUS | Status: AC
  Filled 2020-05-29: qty 40

## 2020-05-29 MED ORDER — PROMETHAZINE HCL 25 MG/ML IJ SOLN: 6.2500 mg | INTRAMUSCULAR | Status: DC | PRN

## 2020-05-29 MED ORDER — LIDOCAINE 2% (20 MG/ML) 5 ML SYRINGE
INTRAMUSCULAR | Status: AC
  Filled 2020-05-29: qty 5

## 2020-05-29 MED ORDER — FENTANYL CITRATE (PF) 100 MCG/2ML IJ SOLN
INTRAMUSCULAR | Status: AC
  Filled 2020-05-29: qty 2

## 2020-05-29 MED ORDER — METHOCARBAMOL 1000 MG/10ML IJ SOLN
1000.0000 mg | Freq: Three times a day (TID) | INTRAVENOUS | Status: DC
  Administered 2020-05-29: 1000 mg via INTRAVENOUS
  Filled 2020-05-29: qty 10

## 2020-05-29 MED ORDER — PROPOFOL 10 MG/ML IV BOLUS
INTRAVENOUS | Status: DC | PRN
  Administered 2020-05-29: 200 mg via INTRAVENOUS

## 2020-05-29 MED ORDER — DEXAMETHASONE SODIUM PHOSPHATE 10 MG/ML IJ SOLN
INTRAMUSCULAR | Status: DC | PRN
  Administered 2020-05-29: 10 mg via INTRAVENOUS

## 2020-05-29 MED ORDER — OXYCODONE HCL 5 MG PO TABS
5.0000 mg | ORAL_TABLET | Freq: Once | ORAL | Status: AC | PRN
  Administered 2020-05-29: 5 mg via ORAL

## 2020-05-29 MED ORDER — BUPIVACAINE HCL (PF) 0.25 % IJ SOLN
INTRAMUSCULAR | Status: AC
  Filled 2020-05-29: qty 30

## 2020-05-29 MED ORDER — ROCURONIUM BROMIDE 10 MG/ML (PF) SYRINGE
PREFILLED_SYRINGE | INTRAVENOUS | Status: DC | PRN
  Administered 2020-05-29: 20 mg via INTRAVENOUS
  Administered 2020-05-29: 70 mg via INTRAVENOUS
  Administered 2020-05-29 (×2): 30 mg via INTRAVENOUS

## 2020-05-29 MED ORDER — MIDAZOLAM HCL 2 MG/2ML IJ SOLN
INTRAMUSCULAR | Status: AC
  Filled 2020-05-29: qty 2

## 2020-05-29 MED ORDER — ONDANSETRON HCL 4 MG/2ML IJ SOLN
INTRAMUSCULAR | Status: AC
  Filled 2020-05-29: qty 2

## 2020-05-29 MED ORDER — BUPIVACAINE HCL (PF) 0.25 % IJ SOLN
INTRAMUSCULAR | Status: DC | PRN
  Administered 2020-05-29: 14 mL

## 2020-05-29 MED ORDER — OXYCODONE HCL 5 MG PO TABS: 5.0000 mg | ORAL_TABLET | Freq: Four times a day (QID) | ORAL | 0 refills | Status: DC | PRN

## 2020-05-29 MED ORDER — FENTANYL CITRATE (PF) 250 MCG/5ML IJ SOLN
INTRAMUSCULAR | Status: DC | PRN
  Administered 2020-05-29: 100 ug via INTRAVENOUS
  Administered 2020-05-29 (×2): 25 ug via INTRAVENOUS
  Administered 2020-05-29: 50 ug via INTRAVENOUS
  Administered 2020-05-29: 25 ug via INTRAVENOUS

## 2020-05-29 MED ORDER — OXYCODONE HCL 5 MG/5ML PO SOLN: 5.0000 mg | Freq: Once | ORAL | Status: AC | PRN

## 2020-05-29 SURGICAL SUPPLY — 50 items
ADH SKN CLS APL DERMABOND .7 (GAUZE/BANDAGES/DRESSINGS) ×1
APL PRP STRL LF DISP 70% ISPRP (MISCELLANEOUS) ×1
BINDER ABDOMINAL 12 ML 46-62 (SOFTGOODS) IMPLANT
CANISTER SUCT 3000ML PPV (MISCELLANEOUS) IMPLANT
CHLORAPREP W/TINT 26 (MISCELLANEOUS) ×2 IMPLANT
COVER SURGICAL LIGHT HANDLE (MISCELLANEOUS) ×2 IMPLANT
COVER WAND RF STERILE (DRAPES) ×2 IMPLANT
DERMABOND ADVANCED (GAUZE/BANDAGES/DRESSINGS) ×1
DERMABOND ADVANCED .7 DNX12 (GAUZE/BANDAGES/DRESSINGS) ×1 IMPLANT
DEVICE SECURE STRAP 25 ABSORB (INSTRUMENTS) ×3 IMPLANT
DEVICE TROCAR PUNCTURE CLOSURE (ENDOMECHANICALS) ×2 IMPLANT
DRAPE INCISE IOBAN 66X45 STRL (DRAPES) ×2 IMPLANT
ELECT CAUTERY BLADE 6.4 (BLADE) IMPLANT
ELECT REM PT RETURN 9FT ADLT (ELECTROSURGICAL) ×2
ELECTRODE REM PT RTRN 9FT ADLT (ELECTROSURGICAL) ×1 IMPLANT
GLOVE BIO SURGEON STRL SZ 6.5 (GLOVE) ×2 IMPLANT
GLOVE BIOGEL PI IND STRL 6 (GLOVE) ×1 IMPLANT
GLOVE BIOGEL PI INDICATOR 6 (GLOVE) ×1
GOWN STRL REUS W/ TWL LRG LVL3 (GOWN DISPOSABLE) ×3 IMPLANT
GOWN STRL REUS W/TWL LRG LVL3 (GOWN DISPOSABLE) ×6
GRASPER SUT TROCAR 14GX15 (MISCELLANEOUS) IMPLANT
KIT BASIN OR (CUSTOM PROCEDURE TRAY) ×2 IMPLANT
KIT TURNOVER KIT B (KITS) ×2 IMPLANT
MARKER SKIN DUAL TIP RULER LAB (MISCELLANEOUS) ×2 IMPLANT
MESH VENTRALIGHT ST 6IN CRC (Mesh General) ×1 IMPLANT
NDL INSUFFLATION 14GA 120MM (NEEDLE) IMPLANT
NDL SPNL 22GX3.5 QUINCKE BK (NEEDLE) IMPLANT
NEEDLE INSUFFLATION 14GA 120MM (NEEDLE) ×2 IMPLANT
NEEDLE SPNL 22GX3.5 QUINCKE BK (NEEDLE) IMPLANT
NS IRRIG 1000ML POUR BTL (IV SOLUTION) ×2 IMPLANT
PAD ARMBOARD 7.5X6 YLW CONV (MISCELLANEOUS) ×4 IMPLANT
PENCIL BUTTON HOLSTER BLD 10FT (ELECTRODE) IMPLANT
SCISSORS LAP 5X35 DISP (ENDOMECHANICALS) ×1 IMPLANT
SET IRRIG TUBING LAPAROSCOPIC (IRRIGATION / IRRIGATOR) IMPLANT
SET TUBE SMOKE EVAC HIGH FLOW (TUBING) ×2 IMPLANT
SHEARS HARMONIC ACE PLUS 36CM (ENDOMECHANICALS) IMPLANT
SLEEVE ENDOPATH XCEL 5M (ENDOMECHANICALS) ×4 IMPLANT
SUT ETHIBOND CT1 BRD #0 30IN (SUTURE) IMPLANT
SUT MNCRL AB 4-0 PS2 18 (SUTURE) ×2 IMPLANT
SUT NOVA NAB DX-16 0-1 5-0 T12 (SUTURE) ×2 IMPLANT
SYR CONTROL 10ML LL (SYRINGE) ×1 IMPLANT
TOWEL GREEN STERILE (TOWEL DISPOSABLE) ×2 IMPLANT
TOWEL GREEN STERILE FF (TOWEL DISPOSABLE) ×2 IMPLANT
TRAY FOLEY W/BAG SLVR 16FR (SET/KITS/TRAYS/PACK)
TRAY FOLEY W/BAG SLVR 16FR ST (SET/KITS/TRAYS/PACK) IMPLANT
TRAY LAPAROSCOPIC MC (CUSTOM PROCEDURE TRAY) ×2 IMPLANT
TROCAR XCEL BLUNT TIP 100MML (ENDOMECHANICALS) IMPLANT
TROCAR XCEL NON-BLD 11X100MML (ENDOMECHANICALS) IMPLANT
TROCAR XCEL NON-BLD 5MMX100MML (ENDOMECHANICALS) ×2 IMPLANT
WATER STERILE IRR 1000ML POUR (IV SOLUTION) ×2 IMPLANT

## 2020-05-29 NOTE — Anesthesia Procedure Notes (Signed)
Procedure Name: Intubation Date/Time: 05/29/2020 8:36 AM Performed by: Thelma Comp, CRNA Pre-anesthesia Checklist: Patient identified, Emergency Drugs available, Suction available and Patient being monitored Patient Re-evaluated:Patient Re-evaluated prior to induction Oxygen Delivery Method: Circle System Utilized Preoxygenation: Pre-oxygenation with 100% oxygen Induction Type: IV induction Ventilation: Mask ventilation without difficulty Laryngoscope Size: Mac and 4 Grade View: Grade I Tube type: Oral Tube size: 7.5 mm Number of attempts: 1 Airway Equipment and Method: Stylet and Oral airway Placement Confirmation: ETT inserted through vocal cords under direct vision,  positive ETCO2 and breath sounds checked- equal and bilateral Secured at: 23 cm Tube secured with: Tape Dental Injury: Teeth and Oropharynx as per pre-operative assessment

## 2020-05-29 NOTE — Progress Notes (Signed)
Nsg Discharge Note  Discharge instructions provided to patient and his mother. Questions answered.  Admit Date:  05/28/2020 Discharge date: 05/29/2020   Matthew Vance to be D/C'd Home per MD order.  AVS completed.  Copy for chart, and copy for patient signed, and dated. Patient/caregiver able to verbalize understanding.  Discharge Medication: Allergies as of 05/29/2020      Reactions   Toradol [ketorolac Tromethamine] Other (See Comments)   Chest pain      Medication List    TAKE these medications   acetaminophen 325 MG tablet Commonly known as: TYLENOL Take 650 mg by mouth every 6 (six) hours as needed for moderate pain.   chlorthalidone 25 MG tablet Commonly known as: HYGROTON Take 25 mg by mouth every evening.   diltiazem 360 MG 24 hr capsule Commonly known as: TIAZAC Take 360 mg by mouth every evening.   metFORMIN 500 MG 24 hr tablet Commonly known as: GLUCOPHAGE-XR Take 500 mg by mouth 2 (two) times daily.   omeprazole 20 MG capsule Commonly known as: PRILOSEC Take 20 mg by mouth every evening.   ondansetron 4 MG disintegrating tablet Commonly known as: ZOFRAN-ODT Take 1 tablet (4 mg total) by mouth every 6 (six) hours as needed for nausea.   oxyCODONE 5 MG immediate release tablet Commonly known as: Oxy IR/ROXICODONE Take 1 tablet (5 mg total) by mouth every 6 (six) hours as needed for breakthrough pain.   propranolol ER 120 MG 24 hr capsule Commonly known as: INDERAL LA TAKE ONE CAPSULE BY MOUTH DAILY   sertraline 100 MG tablet Commonly known as: ZOLOFT Take 200 mg by mouth at bedtime.   Trulicity 2.44 QK/8.6NO Sopn Generic drug: Dulaglutide Inject 0.75 mg as directed every Monday.       Discharge Assessment: Vitals:   05/29/20 1145 05/29/20 1205  BP: (!) 147/75 (!) 144/74  Pulse: 92 96  Resp: 14 16  Temp: 98.1 F (36.7 C) 97.8 F (36.6 C)  SpO2: 93% 90%   Skin clean, dry and intact without evidence of skin break down, no evidence of skin  tears noted. IV catheter discontinued intact. Site without signs and symptoms of complications - no redness or edema noted at insertion site, patient denies c/o pain - only slight tenderness at site.  Dressing with slight pressure applied.  D/c Instructions-Education: Discharge instructions given to patient/family with verbalized understanding. D/c education completed with patient/family including follow up instructions, medication list, d/c activities limitations if indicated, with other d/c instructions as indicated by MD - patient able to verbalize understanding, all questions fully answered. Patient instructed to return to ED, call 911, or call MD for any changes in condition.  Patient escorted via [place], and D/C home via private auto.  Erasmo Leventhal, RN 05/29/2020 4:36 PM

## 2020-05-29 NOTE — Discharge Instructions (Signed)
[facility] _______[facility]  UMBILICAL OR INGUINAL HERNIA REPAIR: POST OP INSTRUCTIONS  Always review your discharge instruction sheet given to you by the facility where your surgery was performed. IF YOU HAVE DISABILITY OR FAMILY LEAVE FORMS, YOU MUST BRING THEM TO THE OFFICE FOR PROCESSING.   DO NOT GIVE THEM TO YOUR DOCTOR.  1. A  prescription for pain medication may be given to you upon discharge.  Take your pain medication as prescribed, if needed.  If narcotic pain medicine is not needed, then you may take acetaminophen (Tylenol) or ibuprofen (Advil) as needed. 2. Take your usually prescribed medications unless otherwise directed. If you need a refill on your pain medication, please contact your pharmacy.  They will contact our office to request authorization. Prescriptions will not be filled after 5 pm or on week-ends. 3. You should follow a light diet the first 24 hours after arrival home, such as soup and crackers, etc.  Be sure to include lots of fluids daily.  Resume your normal diet the day after surgery. 4.Most patients will experience some swelling and bruising around the umbilicus or in the groin and scrotum.  Ice packs and reclining will help.  Swelling and bruising can take several days to resolve.  6. It is common to experience some constipation if taking pain medication after surgery.  Increasing fluid intake and taking a stool softener (such as Colace) will usually help or prevent this problem from occurring.  A mild laxative (Milk of Magnesia or Miralax) should be taken according to package directions if there are no bowel movements after 48 hours. 7. Unless discharge instructions indicate otherwise, you may remove your bandages 24-48 hours after surgery, and you may shower at that time.  You may have steri-strips (small skin tapes) in place directly over the incision.  These strips should be left on the skin for 7-10 days.  If your surgeon used skin glue on the  incision, you may shower in 24 hours.  The glue will flake off over the next 2-3 weeks.  Any sutures or staples will be removed at the office during your follow-up visit. 8. ACTIVITIES:  You may resume regular (light) daily activities beginning the next day--such as daily self-care, walking, climbing stairs--gradually increasing activities as tolerated.  You may have sexual intercourse when it is comfortable.  Refrain from any heavy lifting or straining until approved by your doctor.  a.You may drive when you are no longer taking prescription pain medication, you can comfortably wear a seatbelt, and you can safely maneuver your car and apply brakes. b.RETURN TO WORK:   _____________________________________________  9.You should see your doctor in the office for a follow-up appointment approximately 2-3 weeks after your surgery.  Make sure that you call for this appointment within a day or two after you arrive home to insure a convenient appointment time. 10.OTHER INSTRUCTIONS: _________________________    _____________________________________  WHEN TO CALL YOUR DOCTOR: 1. Fever over 101.0 2. Inability to urinate 3. Nausea and/or vomiting 4. Extreme swelling or bruising 5. Continued bleeding from incision. 6. Increased pain, redness, or drainage from the incision  The clinic staff is available to answer your questions during regular business hours.  Please don't hesitate to call and ask to speak to one of the nurses for clinical concerns.  If you have a medical emergency, go to the nearest emergency room or call 911.  A surgeon from [facility] is always on call at the hospital  [address], [place], [place], [place]  [address] ?  [address], [place], [place]   [address] [phone] ? [phone] ? FAX [phone] Web site: [email or web address]  ........Marland Kitchen   Managing Your Pain After Surgery Without Opioids    Thank you for participating in our  program to help patients manage their pain after surgery without opioids. This is part of our effort to provide you with the best care possible, without exposing you or your family to the risk that opioids pose.  What pain can I expect after surgery? You can expect to have some pain after surgery. This is normal. The pain is typically worse the day after surgery, and quickly begins to get better. Many studies have found that many patients are able to manage their pain after surgery with Over-the-Counter (OTC) medications such as Tylenol and Motrin. If you have a condition that does not allow you to take Tylenol or Motrin, notify your surgical team.  How will I manage my pain? The best strategy for controlling your pain after surgery is around the clock pain control with Tylenol (acetaminophen) and Motrin (ibuprofen or Advil). Alternating these medications with each other allows you to maximize your pain control. In addition to Tylenol and Motrin, you can use heating pads or ice packs on your incisions to help reduce your pain.  How will I alternate your regular strength over-the-counter pain medication? You will take a dose of pain medication every three hours. ; Start by taking 650 mg of Tylenol (2 pills of 325 mg) ; 3 hours later take 600 mg of Motrin (3 pills of 200 mg) ; 3 hours after taking the Motrin take 650 mg of Tylenol ; 3 hours after that take 600 mg of Motrin.   - 1 -  See example - if your first dose of Tylenol is at 12:00 PM   12:00 PM Tylenol 650 mg (2 pills of 325 mg)  3:00 PM Motrin 600 mg (3 pills of 200 mg)  6:00 PM Tylenol 650 mg (2 pills of 325 mg)  9:00 PM Motrin 600 mg (3 pills of 200 mg)  Continue alternating every 3 hours   We recommend that you follow this schedule around-the-clock for at least 3 days after surgery, or until you feel that it is no longer needed. Use the table on the last page of this handout to keep track of the medications you are  taking. Important: Do not take more than 3058m of Tylenol or 32076mof Motrin in a 24-hour period. Do not take ibuprofen/Motrin if you have a history of bleeding stomach ulcers, severe kidney disease, &/or actively taking a blood thinner  What if I still have pain? If you have pain that is not controlled with the over-the-counter pain medications (Tylenol and Motrin or Advil) you might have what we call "breakthrough" pain. You will receive a prescription for a small amount of an opioid pain medication such as Oxycodone, Tramadol, or Tylenol with Codeine. Use these opioid pills in the first 24 hours after surgery if you have breakthrough pain. Do not take more than 1 pill every 4-6 hours.  If you still have uncontrolled pain after using all opioid pills, don't hesitate to call our staff using the number provided. We will help make sure you are managing your pain in the best way possible, and if necessary, we can provide a prescription for additional pain medication.   Day 1    Time  Name of  Medication Number of pills taken  Amount of Acetaminophen  Pain Level   Comments  AM PM       AM PM       AM PM       AM PM       AM PM       AM PM       AM PM       AM PM       Total Daily amount of Acetaminophen Do not take more than  3,000 mg per day      Day 2    Time  Name of Medication Number of pills taken  Amount of Acetaminophen  Pain Level   Comments  AM PM       AM PM       AM PM       AM PM       AM PM       AM PM       AM PM       AM PM       Total Daily amount of Acetaminophen Do not take more than  3,000 mg per day      Day 3    Time  Name of Medication Number of pills taken  Amount of Acetaminophen  Pain Level   Comments  AM PM       AM PM       AM PM       AM PM          AM PM       AM PM       AM PM       AM PM       Total Daily amount of Acetaminophen Do not take more than  3,000 mg per day      Day 4    Time  Name of Medication  Number of pills taken  Amount of Acetaminophen  Pain Level   Comments  AM PM       AM PM       AM PM       AM PM       AM PM       AM PM       AM PM       AM PM       Total Daily amount of Acetaminophen Do not take more than  3,000 mg per day      Day 5    Time  Name of Medication Number of pills taken  Amount of Acetaminophen  Pain Level   Comments  AM PM       AM PM       AM PM       AM PM       AM PM       AM PM       AM PM       AM PM       Total Daily amount of Acetaminophen Do not take more than  3,000 mg per day       Day 6    Time  Name of Medication Number of pills taken  Amount of Acetaminophen  Pain Level  Comments  AM PM       AM PM       AM PM       AM PM       AM PM  AM PM       AM PM       AM PM       Total Daily amount of Acetaminophen Do not take more than  3,000 mg per day      Day 7    Time  Name of Medication Number of pills taken  Amount of Acetaminophen  Pain Level   Comments  AM PM       AM PM       AM PM       AM PM       AM PM       AM PM       AM PM       AM PM       Total Daily amount of Acetaminophen Do not take more than  3,000 mg per day        For additional information about how and where to safely dispose of unused opioid medications - [employer].com.br  Disclaimer: This document contains information and/or instructional materials adapted from [facility] for the typical patient with your condition. It does not replace medical advice from your health care provider because your experience may differ from that of the typical patient. Talk to your health care provider if you have any questions about this document, your condition or your treatment plan. Adapted from [place]

## 2020-05-29 NOTE — Op Note (Signed)
    Operative Note     Date: 04/22/2020   Procedure: laparoscopic umbilical hernia repair with mesh, adhesiolysis x1 hour   Pre-op diagnosis: incarcerated umbilical hernia Post-op diagnosis: same   Indication and clinical history: 69M with incarcerated umbilical hernia   Surgeon: Jesusita Oka, MD Assistant: Grandville Silos, MD   Anesthesiologist: Daiva Huge, MD Anesthesia: General   Findings:   Specimen: none  EBL: <5cc  Drains/Implants: 15cm circular ventralight permanent mesh   Disposition: PACU - hemodynamically stable.   Description of procedure: The patient was positioned supine on the operating room table. General anesthetic induction and intubation were uneventful. Time-out was performed verifying correct patient, procedure, signature of informed consent, and administration of pre-operative antibiotics. The abdomen was prepped and draped in the usual sterile fashion, including the use of an ioban drape.    A Veress needle was used in the left upper quadrant to insufflate the abdomen and the abdomen entered using a 43mm port and an optiview technique. No injury to any intra-abdominal structure was identified. Two additional 61mm ports were placed in the left abdomen under direct visualization and after administration of local anesthetic. Adhesiolysis was performed to reduce the hernia contents of omentum and bowel and resect the adhesed bowel and mesh from the abdominal wall. There did not appear to be any evidence of bowel injury. The mesh was densely adherent to the omentum and the small bowel and it was felt that separation of the mesh from the bowel would not be possible without a bowel resection, thereby precluding the ability to perform the most durable repair with mesh. Since the patient did not and has not had any evidence of bowel obstruction, the decision was made to leave the mesh adherent to the omentum and small bowel. A 15cm circular ventralight permanent mesh was labeled in  four quadrants and #1 novofil suture secured to it. The inferior port site was upsized to an 33mm port to allow the mesh to be inserted into the abdomen. After orientation in the abdomen, a suture passer was used to secure the mesh in four quadrants and a 61mm absorbable tacker used to circumferentially secure it. A photo was taken for the chart. A suture passer was used to close the fascia of the 65mm port site and this was performed under direct visualization. The skin of all port sites were closed with 4-0 monocryl suture. All wounds were dressed with dermabond as sterile dressing.    All sponge and instrument counts were correct at the conclusion of the procedure. The patient was awakened from anesthesia, extubated uneventfully, and transported to the PACU in good condition. There were no complications.    Clinical update provided to the patient;s mother via phone post-operatively.    Jesusita Oka, MD General and [facility]

## 2020-05-29 NOTE — Transfer of Care (Signed)
Immediate Anesthesia Transfer of Care Note  Patient: Matthew Vance  Procedure(s) Performed: LAPAROSCOPIC UMBILICAL HERNIA REPAIR WITH MESH (N/A Abdomen)  Patient Location: PACU  Anesthesia Type:General  Level of Consciousness: drowsy and patient cooperative  Airway & Oxygen Therapy: Patient Spontanous Breathing and Patient connected to face mask oxygen  Post-op Assessment: Report given to RN and Post -op Vital signs reviewed and stable  Post vital signs: Reviewed and stable  Last Vitals:  Vitals Value Taken Time  BP 176/77 05/29/20 1049  Temp    Pulse 102 05/29/20 1049  Resp 17 05/29/20 1049  SpO2 97 % 05/29/20 1049    Last Pain:  Vitals:   05/29/20 0715  TempSrc:   PainSc: 3          Complications: No complications documented.

## 2020-05-29 NOTE — Anesthesia Postprocedure Evaluation (Signed)
Anesthesia Post Note  Patient: Matthew Vance  Procedure(s) Performed: LAPAROSCOPIC UMBILICAL HERNIA REPAIR WITH MESH (N/A Abdomen)     Patient location during evaluation: PACU Anesthesia Type: General Level of consciousness: awake and alert and oriented Pain management: pain level controlled Vital Signs Assessment: post-procedure vital signs reviewed and stable Respiratory status: spontaneous breathing, nonlabored ventilation and respiratory function stable Cardiovascular status: blood pressure returned to baseline Postop Assessment: no apparent nausea or vomiting Anesthetic complications: no   No complications documented.             Brennan Bailey

## 2020-05-29 NOTE — Progress Notes (Signed)
General Surgery Follow Up Note  Subjective:    Overnight Issues:   Objective:  Vital signs for last 24 hours: Temp:  [97.8 F (36.6 C)-98.9 F (37.2 C)] 98.2 F (36.8 C) (02/09 0423) Pulse Rate:  [73-81] 81 (02/09 0423) Resp:  [17-22] 18 (02/09 0423) BP: (112-169)/(74-100) 114/74 (02/09 0423) SpO2:  [95 %-99 %] 95 % (02/09 0423) Weight:  [149.7 kg] 149.7 kg (02/08 1138)  Hemodynamic parameters for last 24 hours:    Intake/Output from previous day: 02/08 0701 - 02/09 0700 In: -  Out: 700 [Urine:700]  Intake/Output this shift: No intake/output data recorded.  Vent settings for last 24 hours:    Physical Exam:  Gen: comfortable, no distress Neuro: non-focal exam HEENT: PERRL Neck: supple CV: RRR Pulm: unlabored breathing Abd: soft, mild peri-umbilical TTP, +umb hernia Extr: wwp, no edema   Results for orders placed or performed during the hospital encounter of 05/28/20 (from the past 24 hour(s))  Resp Panel by RT-PCR (Flu A&B, Covid) Nasopharyngeal Swab     Status: None   Collection Time: 05/28/20  2:15 PM   Specimen: Nasopharyngeal Swab; Nasopharyngeal(NP) swabs in vial transport medium  Result Value Ref Range   SARS Coronavirus 2 by RT PCR NEGATIVE NEGATIVE   Influenza A by PCR NEGATIVE NEGATIVE   Influenza B by PCR NEGATIVE NEGATIVE  Basic metabolic panel     Status: Abnormal   Collection Time: 05/28/20  2:17 PM  Result Value Ref Range   Sodium 133 (L) 135 - 145 mmol/L   Potassium 3.9 3.5 - 5.1 mmol/L   Chloride 97 (L) 98 - 111 mmol/L   CO2 22 22 - 32 mmol/L   Glucose, Bld 132 (H) 70 - 99 mg/dL   BUN 12 6 - 20 mg/dL   Creatinine, Ser 0.75 0.61 - 1.24 mg/dL   Calcium 9.0 8.9 - 10.3 mg/dL   GFR, Estimated >60 >60 mL/min   Anion gap 14 5 - 15  CBC with Differential     Status: Abnormal   Collection Time: 05/28/20  2:17 PM  Result Value Ref Range   WBC 8.3 4.0 - 10.5 K/uL   RBC 4.97 4.22 - 5.81 MIL/uL   Hemoglobin 15.7 13.0 - 17.0 g/dL   HCT 43.1  39.0 - 52.0 %   MCV 86.7 80.0 - 100.0 fL   MCH 31.6 26.0 - 34.0 pg   MCHC 36.4 (H) 30.0 - 36.0 g/dL   RDW 12.5 11.5 - 15.5 %   Platelets 235 150 - 400 K/uL   nRBC 0.0 0.0 - 0.2 %   Neutrophils Relative % 64 %   Neutro Abs 5.3 1.7 - 7.7 K/uL   Lymphocytes Relative 26 %   Lymphs Abs 2.2 0.7 - 4.0 K/uL   Monocytes Relative 7 %   Monocytes Absolute 0.6 0.1 - 1.0 K/uL   Eosinophils Relative 1 %   Eosinophils Absolute 0.1 0.0 - 0.5 K/uL   Basophils Relative 1 %   Basophils Absolute 0.1 0.0 - 0.1 K/uL   Immature Granulocytes 1 %   Abs Immature Granulocytes 0.04 0.00 - 0.07 K/uL  HIV Antibody (routine testing w rflx)     Status: None   Collection Time: 05/28/20  3:26 PM  Result Value Ref Range   HIV Screen 4th Generation wRfx Non Reactive Non Reactive  CBC     Status: Abnormal   Collection Time: 05/28/20  3:26 PM  Result Value Ref Range   WBC 8.0 4.0 - 10.5  K/uL   RBC 4.88 4.22 - 5.81 MIL/uL   Hemoglobin 15.6 13.0 - 17.0 g/dL   HCT 42.7 39.0 - 52.0 %   MCV 87.5 80.0 - 100.0 fL   MCH 32.0 26.0 - 34.0 pg   MCHC 36.5 (H) 30.0 - 36.0 g/dL   RDW 12.5 11.5 - 15.5 %   Platelets 222 150 - 400 K/uL   nRBC 0.0 0.0 - 0.2 %  Creatinine, serum     Status: None   Collection Time: 05/28/20  3:26 PM  Result Value Ref Range   Creatinine, Ser 0.79 0.61 - 1.24 mg/dL   GFR, Estimated >60 >60 mL/min  Hemoglobin A1c     Status: Abnormal   Collection Time: 05/28/20  3:30 PM  Result Value Ref Range   Hgb A1c MFr Bld 6.8 (H) 4.8 - 5.6 %   Mean Plasma Glucose 148.46 mg/dL  CBG monitoring, ED     Status: Abnormal   Collection Time: 05/28/20  4:27 PM  Result Value Ref Range   Glucose-Capillary 128 (H) 70 - 99 mg/dL   Comment 1 Notify RN   Glucose, capillary     Status: Abnormal   Collection Time: 05/28/20  8:18 PM  Result Value Ref Range   Glucose-Capillary 123 (H) 70 - 99 mg/dL  Glucose, capillary     Status: Abnormal   Collection Time: 05/29/20 12:38 AM  Result Value Ref Range    Glucose-Capillary 124 (H) 70 - 99 mg/dL  Basic metabolic panel     Status: Abnormal   Collection Time: 05/29/20  3:34 AM  Result Value Ref Range   Sodium 134 (L) 135 - 145 mmol/L   Potassium 3.9 3.5 - 5.1 mmol/L   Chloride 97 (L) 98 - 111 mmol/L   CO2 27 22 - 32 mmol/L   Glucose, Bld 127 (H) 70 - 99 mg/dL   BUN 13 6 - 20 mg/dL   Creatinine, Ser 0.91 0.61 - 1.24 mg/dL   Calcium 8.7 (L) 8.9 - 10.3 mg/dL   GFR, Estimated >60 >60 mL/min   Anion gap 10 5 - 15  CBC     Status: None   Collection Time: 05/29/20  3:34 AM  Result Value Ref Range   WBC 7.7 4.0 - 10.5 K/uL   RBC 4.52 4.22 - 5.81 MIL/uL   Hemoglobin 14.0 13.0 - 17.0 g/dL   HCT 40.8 39.0 - 52.0 %   MCV 90.3 80.0 - 100.0 fL   MCH 31.0 26.0 - 34.0 pg   MCHC 34.3 30.0 - 36.0 g/dL   RDW 12.6 11.5 - 15.5 %   Platelets 188 150 - 400 K/uL   nRBC 0.0 0.0 - 0.2 %  Glucose, capillary     Status: Abnormal   Collection Time: 05/29/20  4:27 AM  Result Value Ref Range   Glucose-Capillary 131 (H) 70 - 99 mg/dL  Glucose, capillary     Status: Abnormal   Collection Time: 05/29/20  7:23 AM  Result Value Ref Range   Glucose-Capillary 140 (H) 70 - 99 mg/dL    Assessment & Plan:  Present on Admission: . Umbilical hernia    LOS: 0 days   Additional comments:I reviewed the patient's new clinical lab test results.   and I reviewed the patients new imaging test results.    HTN GERD DM Migraines Depression/Anxiety  Recurrent umbilical hernia containing fat and small bowel - CT from [facility] 2/7 shows 3 cm defect containing lobulated fat and small bowel. Patient  was discharged from ED and instructed to follow up with surgeon (Dr. Ladona Horns) but he was unable to get an appointment quickly and had worsening pain. - Plan for lap vs open VHR with possible mesh. Informed consent was obtained after detailed explanation of risks, including bleeding, infection, hematoma/seroma, recurrence, and mesh infection requiring explantation. Discussed possible  need for removal of existing mesh, possible bowel resection if ischemic bowel present (low likelihood), need for primary UHR if bowel resection required. All questions answered to the patient's satisfaction.  FEN - IVF, NPO/NGT VTE - SCDs, lovenox ID - none Foley - none Follow up - TBD  Call mother, Frank Novelo, postop [phone]  Jesusita Oka, MD Trauma & General Surgery Please use [email or web address] to contact on call provider  05/29/2020  *Care during the described time interval was provided by me. I have reviewed this patient's available data, including medical history, events of note, physical examination and test results as part of my evaluation.

## 2020-05-29 NOTE — Discharge Summary (Signed)
Patient ID: Matthew Vance [id number] Nov 18, 1980 40 y.o.  Admit date: 05/28/2020 Discharge date: 05/29/2020  Admitting Diagnosis: Recurrent umbilical hernia containing fat and small bowel  Discharge Diagnosis Incarcerated umbilical hernia  Consultants None   H&P: Patient is a 40 year old male who presented to [facility] earlier today with worsening pain at umbilical hernia site. Patient was seen at [facility] ED yesterday and CT scan showed recurrent umbilical hernia containing fat and small bowel but no evidence of strangulation at that time. He was discharged home and advised to follow up with his surgeon. Patient had a prior umbilical hernia repair ~5 years ago by Dr. Ladona Horns in [place], he thinks with mesh. He called their office today but was unable to get an appointment. He reports pain in central abdomen started about 1 week ago and has progressively worsened. Associated with multiple episodes of n/v. He had 1 BM yesterday, no flatus. Denies fever, chills, chest pain, SOB.   PMH significant for HTN, GERD, DM, migraines, depression/anxiety. Other past abdominal surgery includes laparoscopic cholecystectomy. He is not on any blood thinning medications. Patient has allergy to toradol with a reaction of hives/ throat swelling. Patient reports rare alcohol use and denies tobacco or illicit drug use. He works for [employer].  Procedures Dr. Bobbye Morton - laparoscopic umbilical hernia repair with mesh, adhesiolysis x1 hour - 05/29/2020  Hospital Course:  Per notes, patient CT from [facility] 2/7 shows 3 cm defect containing lobulated fat and small bowel. Per notes, he was discharged and instructed to follow up with surgeon (Dr. Ladona Horns) but he was unable to get an appointment quickly and had worsening pain. With persistent abdominal pain, nausea, and vomiting, our team admitted patient surgery. NGT was placed. Patient was taken to the OR on 2/9 by Dr. Bobbye Morton and underwent laparoscopic umbilical hernia  repair with mesh, adhesiolysis x1 hour. Patient tolerated the procedure well and was transferred back to the floor. Diet was advanced and tolerated. On 2/9 the patient was voiding well, tolerating diet without n/v or increased pain (had spaghetti post op,) ambulating well, pain well controlled, vital signs stable, incisions c/d/i and felt stable for discharge home. Follow up as noted below. Return precautions discussed.   Physical Exam: Gen:  Alert, NAD, pleasant Card:  RRR Pulm:  CTAB, no W/R/R, effort normal Abd: Soft, ND, appropriately tender, +BS, Incisions with glue intact appears well and are without drainage, bleeding, or signs of infection Ext: No LE edema  Psych: A&Ox3  Skin: no rashes noted, warm and dry   Allergies as of 05/29/2020      Reactions   Toradol [ketorolac Tromethamine] Other (See Comments)   Chest pain      Medication List    TAKE these medications   acetaminophen 325 MG tablet Commonly known as: TYLENOL Take 650 mg by mouth every 6 (six) hours as needed for moderate pain.   chlorthalidone 25 MG tablet Commonly known as: HYGROTON Take 25 mg by mouth every evening.   diltiazem 360 MG 24 hr capsule Commonly known as: TIAZAC Take 360 mg by mouth every evening.   metFORMIN 500 MG 24 hr tablet Commonly known as: GLUCOPHAGE-XR Take 500 mg by mouth 2 (two) times daily.   omeprazole 20 MG capsule Commonly known as: PRILOSEC Take 20 mg by mouth every evening.   ondansetron 4 MG disintegrating tablet Commonly known as: ZOFRAN-ODT Take 1 tablet (4 mg total) by mouth every 6 (six) hours as needed for nausea.  oxyCODONE 5 MG immediate release tablet Commonly known as: Oxy IR/ROXICODONE Take 1 tablet (5 mg total) by mouth every 6 (six) hours as needed for breakthrough pain.   propranolol ER 120 MG 24 hr capsule Commonly known as: INDERAL LA TAKE ONE CAPSULE BY MOUTH DAILY   sertraline 100 MG tablet Commonly known as: ZOLOFT Take 200 mg by mouth at  bedtime.   Trulicity 6.18 MQ/5.9CN Sopn Generic drug: Dulaglutide Inject 0.75 mg as directed every Monday.         Follow-up [facility], [place]. Go on 06/20/2020.   Specialty: General Surgery Why: 130pm. Please arrive 30 minutes prior to your appointment for paperwork. Please bring a copy of your photo ID and insurance card.  Contact information: [address] [place] [phone]              Signed: Alferd Apa, [facility] 05/29/2020, 4:03 PM Please see Amion for pager number during day hours 7:00am-4:30pm

## 2020-05-30 ENCOUNTER — Encounter (HOSPITAL_COMMUNITY): Payer: Self-pay | Admitting: Surgery

## 2020-07-01 ENCOUNTER — Other Ambulatory Visit: Payer: Self-pay | Admitting: Student

## 2020-07-01 ENCOUNTER — Other Ambulatory Visit (HOSPITAL_COMMUNITY): Payer: Self-pay | Admitting: Student

## 2020-07-01 DIAGNOSIS — R109 Unspecified abdominal pain: Secondary | ICD-10-CM

## 2020-07-02 ENCOUNTER — Ambulatory Visit (HOSPITAL_BASED_OUTPATIENT_CLINIC_OR_DEPARTMENT_OTHER)
Admission: RE | Admit: 2020-07-02 | Discharge: 2020-07-02 | Disposition: A | Discharge status: Home / Self Care | Payer: No Typology Code available for payment source | Source: Ambulatory Visit | Attending: Student | Admitting: Student

## 2020-07-02 ENCOUNTER — Other Ambulatory Visit: Payer: Self-pay

## 2020-07-02 DIAGNOSIS — K760 Fatty (change of) liver, not elsewhere classified: Secondary | ICD-10-CM | POA: Insufficient documentation

## 2020-07-02 DIAGNOSIS — R109 Unspecified abdominal pain: Secondary | ICD-10-CM

## 2020-07-02 DIAGNOSIS — R1032 Left lower quadrant pain: Secondary | ICD-10-CM | POA: Diagnosis present

## 2020-07-02 DIAGNOSIS — N200 Calculus of kidney: Secondary | ICD-10-CM | POA: Diagnosis not present

## 2020-07-02 LAB — POCT I-STAT CREATININE: Creatinine, Ser: 0.6 mg/dL — ABNORMAL LOW (ref 0.61–1.24)

## 2020-07-02 MED ORDER — IOHEXOL 300 MG/ML  SOLN
100.0000 mL | Freq: Once | INTRAMUSCULAR | Status: AC | PRN
  Administered 2020-07-02: 100 mL via INTRAVENOUS
  Filled 2020-07-02: qty 100

## 2020-09-26 ENCOUNTER — Encounter: Payer: Self-pay | Admitting: *Deleted

## 2020-09-26 DIAGNOSIS — I10 Essential (primary) hypertension: Secondary | ICD-10-CM | POA: Insufficient documentation

## 2020-09-26 DIAGNOSIS — R000 Tachycardia, unspecified: Secondary | ICD-10-CM | POA: Insufficient documentation

## 2020-09-26 DIAGNOSIS — E118 Type 2 diabetes mellitus with unspecified complications: Secondary | ICD-10-CM | POA: Insufficient documentation

## 2020-09-26 NOTE — Progress Notes (Signed)
Cardiology Office Note   Date:  09/29/2020   ID:  Matthew Vance, DOB 1980/10/09, MRN [id number]  PCP:  Caryl Bis, MD  Cardiologist:   Minus Breeding, MD Referring:  Caryl Bis, MD  Chief Complaint  Patient presents with   Palpitations       History of Present Illness: Matthew Vance is a 40 y.o. male who is referred by Caryl Bis, MD presents for tachycardia.     He was in the emergency room when he was playing golf on 09/06/2020.  He had sudden heart rate increased to 150 and chest discomfort.  He went to [facility].   I was able to review these results in [place].  Labs were unremarkable including normal CBC.  His sodium was slightly low.  His white blood cell count was slightly high.  However D-dimer was negative.  ABG demonstrated a normal pH although his O2 was 79.  Chest x-ray was unremarkable.  There was no clear etiology for the tachycardia.  He has had no chest pain.  Has had no calf swelling or leg pain.  He is not describing any new cough fevers or chills.  Has had no PND or orthopnea.  He has never had any prior cardiac work-up.  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  He had a little bit of lower extremity swelling in his work boots.  He is not describing any PND or orthopnea.  He has been more short of breath.  He had some cramping in his neck.  Of note in the ER troponin was negative.  EKG was nonacute.  He was seen by his primary care doctor.  At the time of that appointment on 5/24 he was noted to have his heart rate was still 116.  He was oxygenating well.  Blood pressure was 138/92.    He said his heart rate dropping below 100 since then.   Past Medical History:  Diagnosis Date   Anxiety    Depression    GERD (gastroesophageal reflux disease)    Hypertension    Migraine    Pre-diabetes    Renal disorder    kidney stone    Past Surgical History:  Procedure Laterality Date   AGILE  CAPSULE N/A 09/20/2018   Procedure: AGILE CAPSULE;  Surgeon: Daneil Dolin, MD;  [facility];  Service: Endoscopy;  Laterality: N/A;  7:30am   BIOPSY  10/20/2018   Procedure: BIOPSY;  Surgeon: Daneil Dolin, MD;  [facility];  Service: Endoscopy;;  small bowel   CHOLECYSTECTOMY  06/14/15   Dr. Karlyn Agee: path: cholelithiasis with chronic cholecystitis   COLONOSCOPY  06/04/15   Dr. Doristine Mango: normal   COLONOSCOPY WITH PROPOFOL N/A 09/26/2015   next tcs in 5 years for tubular adenoma. random colon bx negative. subtle muscosal changes of TI with negative bx, int hemorrhoids.    CYSTOSCOPY WITH RETROGRADE PYELOGRAM, URETEROSCOPY AND STENT PLACEMENT Right 11/03/2017   Procedure: CYSTOSCOPY WITH RIGHT RETROGRADE PYELOGRAM, URETEROSCOPY AND STONE EXTRACTION;  Surgeon: Cleon Gustin, MD;  Location: [facility];  Service: Urology;  Laterality: Right;   ENTEROSCOPY N/A 10/20/2018   Procedure: ENTEROSCOPY WITH PROPOFOL;  Surgeon: Daneil Dolin, MD;  [facility];  Service: Endoscopy;  Laterality: N/A;  +/- limited push enteroscopy with the pediatric colonoscope    ESOPHAGOGASTRODUODENOSCOPY  06/27/15   Dr. Karlyn Agee: gastritis  ESOPHAGOGASTRODUODENOSCOPY (EGD) WITH PROPOFOL N/A 10/20/2018   Dr. Gala [facility]: Small hiatal hernia, multiple diminutive appearing gastric polyps not manipulated, duodenum, proximal jejunum status post multiple biopsies which were benign.  Abnormality seen on capsule in study question artifact versus not reached   GIVENS CAPSULE STUDY N/A 09/28/2018   Dr. Gala [facility]: Abnormal more proximal small bowel, images appear to depict for strain/fracture of the mucosa.  Enteroscopy with biopsy warranted.   HAND SURGERY Right    trigger finger   HERNIA REPAIR     umbilical hernia   KNEE SURGERY Right    arthroscopy x4   LYMPH NODE DISSECTION  2019   2 removed in left neck, "bonded together"   partial parathyroidectomy with lymph node dissection   7672   benign   UMBILICAL HERNIA REPAIR N/A 05/29/2020   Procedure: LAPAROSCOPIC UMBILICAL HERNIA REPAIR WITH MESH;  Surgeon: Jesusita Oka, MD;  Location: [place];  Service: General;  Laterality: N/A;  START TIME OF 9:00 AM FOR 120 MINUTES IN DOW ROOM     Current Outpatient Medications  Medication Sig Dispense Refill   acetaminophen (TYLENOL) 325 MG tablet Take 650 mg by mouth every 6 (six) hours as needed for moderate pain.     chlorthalidone (HYGROTON) 25 MG tablet Take 25 mg by mouth every evening.   1   diltiazem (TIAZAC) 360 MG 24 hr capsule Take 360 mg by mouth every evening.   1   metFORMIN (GLUCOPHAGE-XR) 500 MG 24 hr tablet Take 500 mg by mouth 2 (two) times daily.     omeprazole (PRILOSEC) 20 MG capsule Take 20 mg by mouth every evening.      ondansetron (ZOFRAN-ODT) 4 MG disintegrating tablet Take 1 tablet (4 mg total) by mouth every 6 (six) hours as needed for nausea. 20 tablet 0   propranolol ER (INDERAL LA) 120 MG 24 hr capsule TAKE ONE CAPSULE BY MOUTH DAILY (Patient taking differently: Take 120 mg by mouth daily.) 30 capsule 2   sertraline (ZOLOFT) 100 MG tablet Take 200 mg by mouth at bedtime.      TRULICITY 0.94 BS/9.6GE SOPN Inject 0.75 mg as directed every Monday.     No current facility-administered medications for this visit.    Allergies:   Toradol [ketorolac tromethamine]    Social History:  The patient  reports that he has never smoked. His smokeless tobacco use includes snuff. He reports current alcohol use. He reports that he does not use drugs.   Family History:  The patient's family history includes Colitis in his maternal aunt; Irritable bowel syndrome in his paternal grandmother and sister.    ROS:  Please see the history of present illness.   Otherwise, review of systems are positive for none.   All other systems are reviewed and negative.    PHYSICAL EXAM: VS:  BP (!) 154/122   Pulse (!) 134   Ht 6' 4"  (1.93 m)   Wt (!) 340 lb (154.2 kg)   BMI  41.39 kg/m  , BMI Body mass index is 41.39 kg/m. GENERAL:  Well appearing HEENT:  Pupils equal round and reactive, fundi not visualized, oral mucosa unremarkable NECK:  No jugular venous distention, waveform within normal limits, carotid upstroke brisk and symmetric, no bruits, no thyromegaly LYMPHATICS:  No cervical, inguinal adenopathy LUNGS:  Clear to auscultation bilaterally BACK:  No CVA tenderness CHEST:  Unremarkable HEART:  PMI not displaced or sustained,S1 and S2 within normal limits, no S3, no S4, no clicks, no rubs,  no murmurs ABD:  Flat, positive bowel sounds normal in frequency in pitch, no bruits, no rebound, no guarding, no midline pulsatile mass, no hepatomegaly, no splenomegaly EXT:  2 plus pulses throughout, no edema, no cyanosis no clubbing SKIN:  No rashes no nodules NEURO:  Cranial nerves II through XII grossly intact, motor grossly intact throughout PSYCH:  Cognitively intact, oriented to person place and time    EKG:  EKG is ordered today. The ekg ordered today demonstrates sinus tachycardia, rate 134, premature atrial contraction, rightward axis, no acute ST-T wave changes.   Recent Labs: 05/29/2020: BUN 13; Hemoglobin 14.0; Platelets 188; Potassium 3.9; Sodium 134 07/02/2020: Creatinine, Ser 0.60    Lipid Panel    Component Value Date/Time   CHOL 201 (H) 10/13/2017 0824   TRIG 414 (H) 10/13/2017 0824   HDL 33 (L) 10/13/2017 0824   CHOLHDL 6.1 (H) 10/13/2017 0824   LDLCALC Comment 10/13/2017 0824      Wt Readings from Last 3 Encounters:  09/27/20 (!) 340 lb (154.2 kg)  05/28/20 (!) 330 lb (149.7 kg)  10/20/18 (!) 350 lb (158.8 kg)      Other studies Reviewed: Additional studies/ records that were reviewed today include: Extensive review of outside records including Care Everywhere. Review of the above records demonstrates:  Please see elsewhere in the note.     ASSESSMENT AND PLAN:  Tachydardia: I did a bedside echo.  Images were somewhat  suboptimal.  However, I do not see any pericardial effusion.  His ejection fraction did not appear to be severely reduced.  It was difficult for me to see endocardium but overall I thought he was preserved ejection fraction.  I am planning to get an echo early next week.  I am going to repeat labs and draw a TSH as I do not see this.  This is sinus tachycardia does not appear to be have another mechanism.  However, he might ultimately need 3-day monitor.  He understands that if he has any other symptoms this weekend prior to his echo he needs to present to the emergency room.  HTN: His blood pressure is significantly elevated.  On the have to have him keep a blood pressure diary.  It was not high at any of his other recent visits.  He should keep a check on this at home and we will assess it again at the time of his echocardiogram as well.   Current medicines are reviewed at length with the patient today.  The patient does not have concerns regarding medicines.  The following changes have been made:  no change  Labs/ tests ordered today include:   Orders Placed This Encounter  Procedures   Comprehensive Metabolic Panel (CMET)   CBC   TSH   EKG 12-Lead   ECHOCARDIOGRAM COMPLETE     Disposition:   FU with me next week after echo and labs.     Signed, Minus Breeding, MD  09/29/2020 1:35 PM    [facility]

## 2020-09-27 ENCOUNTER — Other Ambulatory Visit: Payer: Self-pay

## 2020-09-27 ENCOUNTER — Encounter: Payer: Self-pay | Admitting: Cardiology

## 2020-09-27 ENCOUNTER — Ambulatory Visit (INDEPENDENT_AMBULATORY_CARE_PROVIDER_SITE_OTHER): Payer: PRIVATE HEALTH INSURANCE | Admitting: Cardiology

## 2020-09-27 VITALS — BP 154/122 | HR 134 | Ht 76.0 in | Wt 340.0 lb

## 2020-09-27 DIAGNOSIS — E118 Type 2 diabetes mellitus with unspecified complications: Secondary | ICD-10-CM

## 2020-09-27 DIAGNOSIS — R000 Tachycardia, unspecified: Secondary | ICD-10-CM

## 2020-09-27 DIAGNOSIS — I10 Essential (primary) hypertension: Secondary | ICD-10-CM

## 2020-09-27 DIAGNOSIS — Z79899 Other long term (current) drug therapy: Secondary | ICD-10-CM

## 2020-09-27 NOTE — Patient Instructions (Signed)
Medication Instructions:  Continue current medications  *If you need a refill on your cardiac medications before your next appointment, please call your pharmacy*   Lab Work: TSH, CBC, CMP  If you have labs (blood work) drawn today and your tests are completely normal, you will receive your results only by: [place] (if you have MyChart) OR A paper copy in the mail If you have any lab test that is abnormal or we need to change your treatment, we will call you to review the results.   Testing/Procedures: Your physician has requested that you have an echocardiogram. Echocardiography is a painless test that uses sound waves to create images of your heart. It provides your doctor with information about the size and shape of your heart and how well your heart's chambers and valves are working. This procedure takes approximately one hour. There are no restrictions for this procedure.  Follow-Up: At [facility], you and your health needs are our priority.  As part of our continuing mission to provide you with exceptional heart care, we have created designated Provider Care Teams.  These Care Teams include your primary Cardiologist (physician) and Advanced Practice Providers (APPs -  Physician Assistants and Nurse Practitioners) who all work together to provide you with the care you need, when you need it.  We recommend signing up for the patient portal called "MyChart".  Sign up information is provided on this After Visit Summary.  MyChart is used to connect with patients for Virtual Visits (Telemedicine).  Patients are able to view lab/test results, encounter notes, upcoming appointments, etc.  Non-urgent messages can be sent to your provider as well.   To learn more about what you can do with MyChart, go to [email or web address].    Your next appointment:   1 week(s)  The format for your next appointment:   In Person  Provider:   Minus Breeding, MD

## 2020-09-29 ENCOUNTER — Encounter: Payer: Self-pay | Admitting: Cardiology

## 2020-10-01 ENCOUNTER — Telehealth: Payer: Self-pay | Admitting: Cardiology

## 2020-10-01 NOTE — Telephone Encounter (Signed)
Pre-cert Verification for the following procedure     ECHO   DATE:10/02/2020  LOCATION: [facility]ICE

## 2020-10-02 ENCOUNTER — Ambulatory Visit (INDEPENDENT_AMBULATORY_CARE_PROVIDER_SITE_OTHER): Payer: Self-pay

## 2020-10-02 DIAGNOSIS — R000 Tachycardia, unspecified: Secondary | ICD-10-CM

## 2020-10-02 LAB — ECHOCARDIOGRAM COMPLETE
Area-P 1/2: 9.25 cm2
Calc EF: 66.9 %
S' Lateral: 2.06 cm
Single Plane A2C EF: 62.8 %
Single Plane A4C EF: 69.5 %

## 2020-10-02 NOTE — Progress Notes (Signed)
Cardiology Office Note   Date:  10/03/2020   ID:  [name], DOB 01/24/1981, MRN [id number]  PCP:  Caryl Bis, MD  Cardiologist:   Minus Breeding, MD Referring:  Caryl Bis, MD  Chief Complaint  Patient presents with   Tachycardia        History of Present Illness: Matthew Vance is a 40 y.o. male who is referred by Caryl Bis, MD presents for tachycardia.   I saw him last week and he had some dyspnea and his heart rate was 130s.  However, his work up in the ED a few days prior had been negative as described in the last note.  I sent him for an echo done yesterday.  This was normal.  THS and repeat CBC were also normal.  He presents for follow up.    Since I saw him last week he is slightly better.  He still is having some shortness of breath.  His heart rate is down today.  His blood pressure is improved.  His oxygen saturations at home have been in the mid to high 90s.  He is not describing any new chest pain, neck or arm discomfort.  Is not having any new presyncope or syncope.   Past Medical History:  Diagnosis Date   Anxiety    Depression    GERD (gastroesophageal reflux disease)    Hypertension    Migraine    Pre-diabetes    Renal disorder    kidney stone    Past Surgical History:  Procedure Laterality Date   AGILE CAPSULE N/A 09/20/2018   Procedure: AGILE CAPSULE;  Surgeon: Daneil Dolin, MD;  [facility];  Service: Endoscopy;  Laterality: N/A;  7:30am   BIOPSY  10/20/2018   Procedure: BIOPSY;  Surgeon: Daneil Dolin, MD;  [facility];  Service: Endoscopy;;  small bowel   CHOLECYSTECTOMY  06/14/15   Dr. Karlyn Agee: path: cholelithiasis with chronic cholecystitis   COLONOSCOPY  06/04/15   Dr. Doristine Mango: normal   COLONOSCOPY WITH PROPOFOL N/A 09/26/2015   next tcs in 5 years for tubular adenoma. random colon bx negative. subtle muscosal changes of TI with negative bx, int hemorrhoids.     CYSTOSCOPY WITH RETROGRADE PYELOGRAM, URETEROSCOPY AND STENT PLACEMENT Right 11/03/2017   Procedure: CYSTOSCOPY WITH RIGHT RETROGRADE PYELOGRAM, URETEROSCOPY AND STONE EXTRACTION;  Surgeon: Cleon Gustin, MD;  [facility];  Service: Urology;  Laterality: Right;   ENTEROSCOPY N/A 10/20/2018   Procedure: ENTEROSCOPY WITH PROPOFOL;  Surgeon: Daneil Dolin, MD;  [facility];  Service: Endoscopy;  Laterality: N/A;  +/- limited push enteroscopy with the pediatric colonoscope    ESOPHAGOGASTRODUODENOSCOPY  06/27/15   Dr. Karlyn Agee: gastritis   ESOPHAGOGASTRODUODENOSCOPY (EGD) WITH PROPOFOL N/A 10/20/2018   Dr. Gala [facility]: Small hiatal hernia, multiple diminutive appearing gastric polyps not manipulated, duodenum, proximal jejunum status post multiple biopsies which were benign.  Abnormality seen on capsule in study question artifact versus not reached   GIVENS CAPSULE STUDY N/A 09/28/2018   Dr. Gala [facility]: Abnormal more proximal small bowel, images appear to depict for strain/fracture of the mucosa.  Enteroscopy with biopsy warranted.   HAND SURGERY Right    trigger finger   HERNIA REPAIR     umbilical hernia   KNEE SURGERY Right    arthroscopy x4   LYMPH NODE DISSECTION  2019   2 removed in left neck, "bonded together"   partial parathyroidectomy  with lymph node dissection  [id number]   benign   UMBILICAL HERNIA REPAIR N/A 05/29/2020   Procedure: LAPAROSCOPIC UMBILICAL HERNIA REPAIR WITH MESH;  Surgeon: Jesusita Oka, MD;  Location: [place];  Service: General;  Laterality: N/A;  START TIME OF 9:00 AM FOR 120 MINUTES IN DOW ROOM     Current Outpatient Medications  Medication Sig Dispense Refill   acetaminophen (TYLENOL) 325 MG tablet Take 650 mg by mouth every 6 (six) hours as needed for moderate pain.     chlorthalidone (HYGROTON) 25 MG tablet Take 25 mg by mouth every evening.   1   diltiazem (TIAZAC) 360 MG 24 hr capsule Take 360 mg by mouth every evening.   1   metFORMIN  (GLUCOPHAGE-XR) 500 MG 24 hr tablet Take 500 mg by mouth 2 (two) times daily.     omeprazole (PRILOSEC) 20 MG capsule Take 20 mg by mouth every evening.      ondansetron (ZOFRAN-ODT) 4 MG disintegrating tablet Take 1 tablet (4 mg total) by mouth every 6 (six) hours as needed for nausea. 20 tablet 0   propranolol ER (INDERAL LA) 120 MG 24 hr capsule TAKE ONE CAPSULE BY MOUTH DAILY (Patient taking differently: Take 120 mg by mouth daily.) 30 capsule 2   sertraline (ZOLOFT) 100 MG tablet Take 200 mg by mouth at bedtime.      TRULICITY 4.69 GE/9.5MW SOPN Inject 0.75 mg as directed every Monday.     No current facility-administered medications for this visit.    Allergies:   Toradol [ketorolac tromethamine]    ROS:  Please see the history of present illness.   Otherwise, review of systems are positive for none.   All other systems are reviewed and negative.    PHYSICAL EXAM: VS:  BP 126/82   Pulse (!) 101   Ht 6' 4"  (1.93 m)   Wt (!) 340 lb (154.2 kg)   BMI 41.39 kg/m  , BMI Body mass index is 41.39 kg/m. GENERAL:  Well appearing NECK:  No jugular venous distention, waveform within normal limits, carotid upstroke brisk and symmetric, no bruits, no thyromegaly LUNGS:  Clear to auscultation bilaterally CHEST:  Unremarkable HEART:  PMI not displaced or sustained,S1 and S2 within normal limits, no S3, no S4, no clicks, no rubs, no murmurs ABD:  Flat, positive bowel sounds normal in frequency in pitch, no bruits, no rebound, no guarding, no midline pulsatile mass, no hepatomegaly, no splenomegaly EXT:  2 plus pulses throughout, no edema, no cyanosis no clubbing    EKG:  EKG is not ordered today. NA   Recent Labs: 05/29/2020: BUN 13; Hemoglobin 14.0; Platelets 188; Potassium 3.9; Sodium 134 07/02/2020: Creatinine, Ser 0.60    Lipid Panel    Component Value Date/Time   CHOL 201 (H) 10/13/2017 0824   TRIG 414 (H) 10/13/2017 0824   HDL 33 (L) 10/13/2017 0824   CHOLHDL 6.1 (H)  10/13/2017 0824   LDLCALC Comment 10/13/2017 0824      Wt Readings from Last 3 Encounters:  10/03/20 (!) 340 lb (154.2 kg)  09/27/20 (!) 340 lb (154.2 kg)  05/28/20 (!) 330 lb (149.7 kg)      Other studies Reviewed: Additional studies/ records that were reviewed today include: Echo, labs Review of the above records demonstrates:  Please see elsewhere in the note.     ASSESSMENT AND PLAN:  Tachydardia:   His heart rate is down today.  However, he did have a postural tachycardia when standing up.  The only other objective finding has been elevated white blood cell count.  He could have some postural orthostatic tachycardia potentially even related to viral illness that went undiagnosed.    We talked about specifically fluid loading.  We talked about being more generous with his salt particularly while he is working outside in the heat this summer.  I talked about thigh compression garments.  He is going to continue to monitor his heart rate.  I would like to see him back in 2 months or sooner if he gets worse rather than better.  HTN: His blood pressure is improved.  He will keep an eye on this.  Current medicines are reviewed at length with the patient today.  The patient does not have concerns regarding medicines.  The following changes have been made: None  Labs/ tests ordered today include: None  Orders Placed This Encounter  Procedures   EKG 12-Lead      Disposition:   FU with me as needed   Signed, Minus Breeding, MD  10/03/2020 9:43 AM    [place]

## 2020-10-03 ENCOUNTER — Encounter: Payer: Self-pay | Admitting: Cardiology

## 2020-10-03 ENCOUNTER — Ambulatory Visit (INDEPENDENT_AMBULATORY_CARE_PROVIDER_SITE_OTHER): Payer: Self-pay | Admitting: Cardiology

## 2020-10-03 VITALS — BP 126/82 | HR 101 | Ht 76.0 in | Wt 340.0 lb

## 2020-10-03 DIAGNOSIS — I10 Essential (primary) hypertension: Secondary | ICD-10-CM

## 2020-10-03 DIAGNOSIS — R000 Tachycardia, unspecified: Secondary | ICD-10-CM

## 2020-10-03 NOTE — Patient Instructions (Addendum)
Medication Instructions:  Your physician recommends that you continue on your current medications as directed. Please refer to the Current Medication list given to you today.  Labwork: none  Testing/Procedures: none  Follow-Up: Your physician recommends that you schedule a follow-up appointment in: 2 months  Any Other Special Instructions Will Be Listed Below (If Applicable).  If you need a refill on your cardiac medications before your next appointment, please call your pharmacy.

## 2020-10-23 ENCOUNTER — Telehealth: Payer: Self-pay | Admitting: Cardiology

## 2020-10-23 NOTE — Telephone Encounter (Signed)
Pt's mother notified.

## 2020-10-23 NOTE — Telephone Encounter (Signed)
Mother called stating that patient is at work. He called stating that he was having a spell with his HR going up HR 173 @ 7 minutes. States that his heart feels like it is "squeezing tight"

## 2020-10-23 NOTE — Telephone Encounter (Signed)
Spoke with pt's mother, Jaimin Krupka, and encouraged her to get pt to go to ED for emergent evaluation. Pt was not at home at the time but at work. Pt's mother stated that pt was not c/o cp, but said pt told her it was more of a squeezing feeling in his chest and his hr had stayed in the 170's for 7+ minutes so far. Pt's mother stated that she highly doubted pt would go to ER, but would advise him to go. Informed pt's mother that we did not have a Cardiologist in the office today, NP's schedule was full, and that an emergent ED evaluation would be much more helpful to pt than being seen in the office. Pt's mother voiced understanding. I will forward to pt's primary cardiologist for recommendations/review.

## 2020-12-04 NOTE — Progress Notes (Signed)
Cardiology Office Note  Date: 12/05/2020   ID: Matthew Vance, DOB [birth year], MRN [id number]  PCP:  Caryl Bis, MD  Cardiologist:  Minus Breeding, MD Electrophysiologist:  None   Chief Complaint: 2 month follow up  History of Present Illness: Matthew Vance is a 40 y.o. male with a history of tachycardia, hypertension, prediabetes.  He was last seen by Dr. Percival Spanish on 10/03/2020 who had seen him the prior week when he was experiencing some dyspnea and heart rate was in the 130s.  He had been sent for an echocardiogram by Dr. Percival Spanish which was normal.  TSH and CBC were normal.  He stated he was slightly better was still having some shortness of breath and heart rate was down during that visit.  He did have some postural tachycardia when standing up.  Dr. Percival Spanish mentioned the only other objective finding was elevated white blood cell count.  His blood pressure had improved.  He was not describing any new chest pain, neck, or arm discomfort.  He was not having any presyncope or syncope.  Dr. Percival Spanish discussed fluid loading and being more generous with his salt especially while working outside.  Plans were to continue to monitor his heart rate and he would be seen in 2 months.  He is here for follow-up today.  His primary complaint is feeling like he is worn out by the end of the day.  States he has had a couple of episodes of palpitations but less so recently.  Blood pressure remains elevated at 142/100.  Heart rate 76 today.  Complaining of chest pressure/chest pain/chest tightness when exerting.  He he states he does snore some.  He denies any hypoapneic or apneic episodes.  He does state he feels tired a lot of the time with no energy.  He states his diabetes is under decent control with recent hemoglobin A1c between 6 and 6.5%.  We discussed the echocardiogram. Echocardiogram 10/02/2020 demonstrated EF of 60 to 65% no WMA's.  Global longitudinal strain was abnormal but  report mentions straining may not be accurate due to tachycardia and poor septal tracking.  He denies any history of sleep apnea.  He does state he snores some.  Also states he feels tired and worn out by the end of the day.  States he had a prior sleep study around 5 years ago which apparently did not show any evidence of sleep apnea.  Past Medical History:  Diagnosis Date   Anxiety    Depression    GERD (gastroesophageal reflux disease)    Hypertension    Migraine    Pre-diabetes    Renal disorder    kidney stone    Past Surgical History:  Procedure Laterality Date   AGILE CAPSULE N/A 09/20/2018   Procedure: AGILE CAPSULE;  Surgeon: Daneil Dolin, MD;  [facility];  Service: Endoscopy;  Laterality: N/A;  7:30am   BIOPSY  10/20/2018   Procedure: BIOPSY;  Surgeon: Daneil Dolin, MD;  [facility]: [facility];  Service: Endoscopy;;  small bowel   CHOLECYSTECTOMY  06/14/15   Dr. Karlyn Agee: path: cholelithiasis with chronic cholecystitis   COLONOSCOPY  06/04/15   Dr. Doristine Mango: normal   COLONOSCOPY WITH PROPOFOL N/A 09/26/2015   next tcs in 5 years for tubular adenoma. random colon bx negative. subtle muscosal changes of TI with negative bx, int hemorrhoids.    CYSTOSCOPY WITH RETROGRADE PYELOGRAM, URETEROSCOPY AND STENT PLACEMENT Right 11/03/2017  Procedure: CYSTOSCOPY WITH RIGHT RETROGRADE PYELOGRAM, URETEROSCOPY AND STONE EXTRACTION;  Surgeon: Cleon Gustin, MD;  Location: [facility];  Service: Urology;  Laterality: Right;   ENTEROSCOPY N/A 10/20/2018   Procedure: ENTEROSCOPY WITH PROPOFOL;  Surgeon: Daneil Dolin, MD;  [facility]: [facility];  Service: Endoscopy;  Laterality: N/A;  +/- limited push enteroscopy with the pediatric colonoscope    ESOPHAGOGASTRODUODENOSCOPY  06/27/15   Dr. Karlyn Agee: gastritis   ESOPHAGOGASTRODUODENOSCOPY (EGD) WITH PROPOFOL N/A 10/20/2018   Dr. Gala Romney: Small hiatal hernia, multiple diminutive appearing gastric polyps not  manipulated, duodenum, proximal jejunum status post multiple biopsies which were benign.  Abnormality seen on capsule in study question artifact versus not reached   GIVENS CAPSULE STUDY N/A 09/28/2018   Dr. Gala Romney: Abnormal more proximal small bowel, images appear to depict for strain/fracture of the mucosa.  Enteroscopy with biopsy warranted.   HAND SURGERY Right    trigger finger   HERNIA REPAIR     umbilical hernia   KNEE SURGERY Right    arthroscopy x4   LYMPH NODE DISSECTION  2019   2 removed in left neck, "bonded together"   partial parathyroidectomy with lymph node dissection  4268   benign   UMBILICAL HERNIA REPAIR N/A 05/29/2020   Procedure: LAPAROSCOPIC UMBILICAL HERNIA REPAIR WITH MESH;  Surgeon: Jesusita Oka, MD;  Location: [facility];  Service: General;  Laterality: N/A;  START TIME OF 9:00 AM FOR 120 MINUTES IN DOW ROOM    Current Outpatient Medications  Medication Sig Dispense Refill   acetaminophen (TYLENOL) 325 MG tablet Take 650 mg by mouth every 6 (six) hours as needed for moderate pain.     chlorthalidone (HYGROTON) 25 MG tablet Take 25 mg by mouth every evening.   1   diltiazem (TIAZAC) 360 MG 24 hr capsule Take 360 mg by mouth every evening.   1   metFORMIN (GLUCOPHAGE-XR) 500 MG 24 hr tablet Take 500 mg by mouth 2 (two) times daily.     omeprazole (PRILOSEC) 20 MG capsule Take 20 mg by mouth every evening.      ondansetron (ZOFRAN-ODT) 4 MG disintegrating tablet Take 1 tablet (4 mg total) by mouth every 6 (six) hours as needed for nausea. 20 tablet 0   propranolol ER (INDERAL LA) 120 MG 24 hr capsule TAKE ONE CAPSULE BY MOUTH DAILY (Patient taking differently: Take 120 mg by mouth daily.) 30 capsule 2   sertraline (ZOLOFT) 100 MG tablet Take 200 mg by mouth at bedtime.      TRULICITY 3.41 DQ/2.2WL SOPN Inject 0.75 mg as directed every Monday.     No current facility-administered medications for this visit.   Allergies:  Toradol [ketorolac tromethamine]   Social  History: The patient  reports that he has never smoked. His smokeless tobacco use includes snuff. He reports current alcohol use. He reports that he does not use drugs.   Family History: The patient's family history includes Colitis in his maternal aunt; Irritable bowel syndrome in his paternal grandmother and sister.   ROS:  Please see the history of present illness. Otherwise, complete review of systems is positive for none.  All other systems are reviewed and negative.   Physical Exam: VS:  BP (!) 142/100   Pulse 76   Ht 6' 4"  (1.93 m)   Wt (!) 333 lb 9.6 oz (151.3 kg)   SpO2 96%   BMI 40.61 kg/m , BMI Body mass index is 40.61 kg/m.  Wt Readings from Last  3 Encounters:  12/05/20 (!) 333 lb 9.6 oz (151.3 kg)  10/03/20 (!) 340 lb (154.2 kg)  09/27/20 (!) 340 lb (154.2 kg)    General: Morbidly obese patient appears comfortable at rest. Neck: Supple, no elevated JVP or carotid bruits, no thyromegaly. Lungs: Clear to auscultation, nonlabored breathing at rest. Cardiac: Regular rate and rhythm, no S3 or significant systolic murmur, no pericardial rub. Extremities: No pitting edema, distal pulses 2+. Skin: Warm and dry. Musculoskeletal: No kyphosis. Neuropsychiatric: Alert and oriented x3, affect grossly appropriate.  ECG: 12/05/2020 EKG normal sinus rhythm rate of 93.  Recent Labwork: 05/29/2020: BUN 13; Hemoglobin 14.0; Platelets 188; Potassium 3.9; Sodium 134 07/02/2020: Creatinine, Ser 0.60     Component Value Date/Time   CHOL 201 (H) 10/13/2017 0824   TRIG 414 (H) 10/13/2017 0824   HDL 33 (L) 10/13/2017 0824   CHOLHDL 6.1 (H) 10/13/2017 0824   LDLCALC Comment 10/13/2017 0824    Other Studies Reviewed Today:  Echocardiogram 10/02/2020  1. Strain may not be accurate due to tachycardia and poor septal tracking. Left ventricular ejection fraction, by estimation, is 60 to 65%. The left ventricle has normal function. The left ventricle has no regional wall motion abnormalities.  Left ventricular diastolic parameters were normal. The average left ventricular global longitudinal strain is -10.5 %. The global longitudinal strain is abnormal. 2. Right ventricular systolic function is normal. The right ventricular size is normal. 3. The mitral valve is normal in structure. No evidence of mitral valve regurgitation. No evidence of mitral stenosis. 4. The aortic valve is normal in structure. Aortic valve regurgitation is not visualized. No aortic stenosis is present. 5. The inferior vena cava is normal in size with greater than 50% respiratory variability, suggesting right atrial pressure of 3 mmHg.  Assessment and Plan:  1. Tachycardia   2. Essential hypertension   3. Chest pain of uncertain etiology    1. Tachycardia/palpitations Heart rate is well controlled today at 45, however he states he has had a couple episodes of palpitations/racing heart with some associated dizziness.  EKG today shows normal sinus rhythm rate of 93.  Please get a 14-day ZIO monitor to assess for arrhythmias.  Continue diltiazem 360 mg daily.  2. Essential hypertension Blood pressure elevated today at 142/100.  Continue propranolol ER 120 mg p.o. daily.  Continue chlorthalidone 25 mg p.o. daily.  Add hydralazine 25 mg p.o. twice daily.  Advised patient to check his blood pressures and let us know what they have been running over the next 2 weeks.  We may need to uptitrate hydralazine based on results.  3. Chest pain/chest pressure of uncertain etiology Complaining of chest pain/chest tightness with increased fatigue.  States chest tightness/pain comes on with exertion and relieved with rest.  Please get a Lexiscan stress test.   Medication Adjustments/Labs and Tests Ordered: Current medicines are reviewed at length with the patient today.  Concerns regarding medicines are outlined above.   Disposition: Follow-up with Dr. Percival Spanish or APP 2-3 months.  Signed, Levell July, NP 12/05/2020  8:26 AM    [facility], [place], [place] [address] Phone: [phone]; Fax: [phone]

## 2020-12-05 ENCOUNTER — Encounter: Payer: Self-pay | Admitting: *Deleted

## 2020-12-05 ENCOUNTER — Ambulatory Visit (INDEPENDENT_AMBULATORY_CARE_PROVIDER_SITE_OTHER): Payer: No Typology Code available for payment source | Admitting: Family Medicine

## 2020-12-05 ENCOUNTER — Other Ambulatory Visit: Payer: Self-pay | Admitting: Family Medicine

## 2020-12-05 ENCOUNTER — Encounter: Payer: Self-pay | Admitting: Family Medicine

## 2020-12-05 ENCOUNTER — Telehealth: Payer: Self-pay | Admitting: Family Medicine

## 2020-12-05 ENCOUNTER — Ambulatory Visit (INDEPENDENT_AMBULATORY_CARE_PROVIDER_SITE_OTHER): Payer: No Typology Code available for payment source

## 2020-12-05 VITALS — BP 142/100 | HR 76 | Ht 76.0 in | Wt 333.6 lb

## 2020-12-05 DIAGNOSIS — R079 Chest pain, unspecified: Secondary | ICD-10-CM | POA: Diagnosis not present

## 2020-12-05 DIAGNOSIS — R000 Tachycardia, unspecified: Secondary | ICD-10-CM

## 2020-12-05 DIAGNOSIS — I10 Essential (primary) hypertension: Secondary | ICD-10-CM | POA: Diagnosis not present

## 2020-12-05 DIAGNOSIS — R002 Palpitations: Secondary | ICD-10-CM

## 2020-12-05 MED ORDER — HYDRALAZINE HCL 25 MG PO TABS: 25.0000 mg | ORAL_TABLET | Freq: Two times a day (BID) | ORAL | 1 refills | Status: DC

## 2020-12-05 NOTE — Patient Instructions (Addendum)
Medication Instructions:  Your physician has recommended you make the following change in your medication:  Start hydralazine 25 mg twice a day  Continue all other meds the same   Labwork: None   Testing/Procedures: Your physician has requested that you have a lexiscan myoview. For further information please visit [email or web address]. Please follow instruction sheet, as given. ZIO- Long Term Monitor Instructions   Your physician has requested you wear your ZIO patch monitor 14 days.   This is a single patch monitor.  [employer] supplies one patch monitor per enrollment.  Additional stickers are not available.   Please do not apply patch if you will be having a Nuclear Stress Test, Echocardiogram, Cardiac CT, MRI, or Chest Xray during the time frame you would be wearing the monitor. The patch cannot be worn during these tests.  You cannot remove and re-apply the ZIO XT patch monitor.   Your ZIO patch monitor will be sent USPS Priority mail from [facility] directly to your home address. The monitor may also be mailed to a PO BOX if home delivery is not available.   It may take 3-5 days to receive your monitor after you have been enrolled.   Once you have received you monitor, please review enclosed instructions.  Your monitor has already been registered assigning a specific monitor serial # to you.   Applying the monitor   Shave hair from upper left chest.   Hold abrader disc by orange tab.  Rub abrader in 40 strokes over left upper chest as indicated in your monitor instructions.   Clean area with 4 enclosed alcohol pads .  Use all pads to assure are is cleaned thoroughly.  Let dry.   Apply patch as indicated in monitor instructions.  Patch will be place under collarbone on left side of chest with arrow pointing upward.   Rub patch adhesive wings for 2 minutes.Remove white label marked "1".  Remove white label marked "2".  Rub patch adhesive wings for 2 additional minutes.    While looking in a mirror, press and release button in center of patch.  A small green light will flash 3-4 times .  This will be your only indicator the monitor has been turned on.     Do not shower for the first 24 hours.  You may shower after the first 24 hours.   Press button if you feel a symptom. You will hear a small click.  Record Date, Time and Symptom in the Patient Log Book.   When you are ready to remove patch, follow instructions on last 2 pages of Patient Log Book.  Stick patch monitor onto last page of Patient Log Book.   Place Patient Log Book in [place] box.  Use locking tab on box and tape box closed securely.  The [employer] has [employer] on it.  Please place in mailbox as soon as possible.  Your physician should have your test results approximately 7 days after the monitor has been mailed back to [facility].   Call [facility] at [phone] if you have questions regarding your ZIO XT patch monitor.  Call them immediately if you see an orange light blinking on your monitor.   If your monitor falls off in less than 4 days contact our Monitor department at [phone].  If your monitor becomes loose or falls off after 4 days call [employer] at [phone] for suggestions on securing your monitor.  Follow-Up: Your physician recommends that you  schedule a follow-up appointment in: 2-3 months  Any Other Special Instructions Will Be Listed Below (If Applicable). Your physician has requested that you regularly monitor and record your blood pressure readings at home. Please use the same machine at the same time of day to check your readings and record them. Call with readings in two weeks.   If you need a refill on your cardiac medications before your next appointment, please call your pharmacy.

## 2020-12-05 NOTE — Telephone Encounter (Signed)
Checking percert on the following patient for testing scheduled at [facility]    LEXISCAN  12/12/2020  14 DAY ZIO MONITOR

## 2020-12-05 NOTE — Addendum Note (Signed)
Addended by: Merlene Laughter on: 12/05/2020 11:41 AM   Modules accepted: Orders

## 2020-12-12 ENCOUNTER — Ambulatory Visit (HOSPITAL_COMMUNITY)
Admission: RE | Admit: 2020-12-12 | Discharge: 2020-12-12 | Disposition: A | Discharge status: Home / Self Care | Payer: No Typology Code available for payment source | Source: Ambulatory Visit | Attending: Family Medicine | Admitting: Family Medicine

## 2020-12-12 ENCOUNTER — Other Ambulatory Visit: Payer: Self-pay

## 2020-12-12 ENCOUNTER — Encounter (HOSPITAL_COMMUNITY): Payer: Self-pay

## 2020-12-12 DIAGNOSIS — R079 Chest pain, unspecified: Secondary | ICD-10-CM | POA: Diagnosis not present

## 2020-12-12 HISTORY — DX: Type 2 diabetes mellitus without complications: E11.9

## 2020-12-12 LAB — NM MYOCAR MULTI W/SPECT W/WALL MOTION / EF
Base ST Depression (mm): 0 mm
LV dias vol: 153 mL (ref 62–150)
LV sys vol: 62 mL
Nuc Stress EF: 60 %
Peak HR: 104 {beats}/min
RATE: 0.4
Rest HR: 85 {beats}/min
Rest Nuclear Isotope Dose: 10 mCi
SDS: 3
SRS: 0
SSS: 3
ST Depression (mm): 0 mm
Stress Nuclear Isotope Dose: 32.5 mCi
TID: 1.22

## 2020-12-12 MED ORDER — TECHNETIUM TC 99M TETROFOSMIN IV KIT
30.0000 | PACK | Freq: Once | INTRAVENOUS | Status: AC | PRN
  Administered 2020-12-12: 32.5 via INTRAVENOUS

## 2020-12-12 MED ORDER — SODIUM CHLORIDE FLUSH 0.9 % IV SOLN
INTRAVENOUS | Status: AC
  Administered 2020-12-12: 10 mL via INTRAVENOUS
  Filled 2020-12-12: qty 10

## 2020-12-12 MED ORDER — TECHNETIUM TC 99M TETROFOSMIN IV KIT
10.0000 | PACK | Freq: Once | INTRAVENOUS | Status: AC | PRN
  Administered 2020-12-12: 10 via INTRAVENOUS

## 2020-12-12 MED ORDER — REGADENOSON 0.4 MG/5ML IV SOLN
INTRAVENOUS | Status: AC
  Administered 2020-12-12: 0.4 mg via INTRAVENOUS
  Filled 2020-12-12: qty 5

## 2020-12-15 DIAGNOSIS — R002 Palpitations: Secondary | ICD-10-CM

## 2020-12-16 ENCOUNTER — Telehealth: Payer: Self-pay | Admitting: *Deleted

## 2020-12-16 NOTE — Telephone Encounter (Signed)
Notofied patient Copy sent to PCP

## 2020-12-16 NOTE — Telephone Encounter (Signed)
-----   Message from Merlene Laughter, RN sent at 12/16/2020 12:25 PM EDT -----  ----- Message ----- From: Merlene Laughter, RN Sent: 12/13/2020   8:11 AM EDT To: Merlene Laughter, RN   ----- Message ----- From: Verta Ellen., NP Sent: 12/12/2020   8:59 PM EDT To: Laurine Blazer, LPN  Please call the patient and let him know the stress test was considered a low risk study per the interpreting physician. Thank You  Verta Ellen, NP  12/12/2020 8:57 PM

## 2021-01-07 ENCOUNTER — Encounter: Payer: Self-pay | Admitting: *Deleted

## 2021-02-09 NOTE — Progress Notes (Deleted)
Cardiology Office Note  Date: 02/09/2021   ID: Elroy, Schembri 05-Aug-1980, MRN [id number]  PCP:  Caryl Bis, MD  Cardiologist:  Minus Breeding, MD Electrophysiologist:  None   Chief Complaint: 2 month follow up  History of Present Illness: Matthew Vance is a 40 y.o. male with a history of tachycardia, hypertension, prediabetes.  He was last seen by Dr. Percival Spanish on 10/03/2020 who had seen him the prior week when he was experiencing some dyspnea and heart rate was in the 130s.  He had been sent for an echocardiogram by Dr. Percival Spanish which was normal.  TSH and CBC were normal.  He stated he was slightly better was still having some shortness of breath and heart rate was down during that visit.  He did have some postural tachycardia when standing up.  Dr. Percival Spanish mentioned the only other objective finding was elevated white blood cell count.  His blood pressure had improved.  He was not describing any new chest pain, neck, or arm discomfort.  He was not having any presyncope or syncope.  Dr. Percival Spanish discussed fluid loading and being more generous with his salt especially while working outside.  Plans were to continue to monitor his heart rate and he would be seen in 2 months.  He is here for follow-up today.  His primary complaint is feeling like he is worn out by the end of the day.  States he has had a couple of episodes of palpitations but less so recently.  Blood pressure remains elevated at 142/100.  Heart rate 76 today.  Complaining of chest pressure/chest pain/chest tightness when exerting.  He he states he does snore some.  He denies any hypoapneic or apneic episodes.  He does state he feels tired a lot of the time with no energy.  He states his diabetes is under decent control with recent hemoglobin A1c between 6 and 6.5%.  We discussed the echocardiogram. Echocardiogram 10/02/2020 demonstrated EF of 60 to 65% no WMA's.  Global longitudinal strain was abnormal but  report mentions straining may not be accurate due to tachycardia and poor septal tracking.  He denies any history of sleep apnea.  He does state he snores some.  Also states he feels tired and worn out by the end of the day.  States he had a prior sleep study around 5 years ago which apparently did not show any evidence of sleep apnea.  Stress test and monitor  Past Medical History:  Diagnosis Date   Anxiety    Depression    Diabetes mellitus without complication (HCC)    GERD (gastroesophageal reflux disease)    Hypertension    Migraine    Pre-diabetes    Renal disorder    kidney stone    Past Surgical History:  Procedure Laterality Date   AGILE CAPSULE N/A 09/20/2018   Procedure: AGILE CAPSULE;  Surgeon: Daneil Dolin, MD;  Location: [facility];  Service: Endoscopy;  Laterality: N/A;  7:30am   BIOPSY  10/20/2018   Procedure: BIOPSY;  Surgeon: Daneil Dolin, MD;  Location: [facility];  Service: Endoscopy;;  small bowel   CHOLECYSTECTOMY  06/14/15   Dr. Karlyn Agee: path: cholelithiasis with chronic cholecystitis   COLONOSCOPY  06/04/15   Dr. Doristine Mango: normal   COLONOSCOPY WITH PROPOFOL N/A 09/26/2015   next tcs in 5 years for tubular adenoma. random colon bx negative. subtle muscosal changes of TI with negative bx, int hemorrhoids.  CYSTOSCOPY WITH RETROGRADE PYELOGRAM, URETEROSCOPY AND STENT PLACEMENT Right 11/03/2017   Procedure: CYSTOSCOPY WITH RIGHT RETROGRADE PYELOGRAM, URETEROSCOPY AND STONE EXTRACTION;  Surgeon: Cleon Gustin, MD;  Location: [facility];  Service: Urology;  Laterality: Right;   ENTEROSCOPY N/A 10/20/2018   Procedure: ENTEROSCOPY WITH PROPOFOL;  Surgeon: Daneil Dolin, MD;  Location: [facility];  Service: Endoscopy;  Laterality: N/A;  +/- limited push enteroscopy with the pediatric colonoscope    ESOPHAGOGASTRODUODENOSCOPY  06/27/15   Dr. Karlyn Agee: gastritis   ESOPHAGOGASTRODUODENOSCOPY (EGD) WITH PROPOFOL N/A 10/20/2018   Dr. Gala Romney:  Small hiatal hernia, multiple diminutive appearing gastric polyps not manipulated, duodenum, proximal jejunum status post multiple biopsies which were benign.  Abnormality seen on capsule in study question artifact versus not reached   GIVENS CAPSULE STUDY N/A 09/28/2018   Dr. Gala Romney: Abnormal more proximal small bowel, images appear to depict for strain/fracture of the mucosa.  Enteroscopy with biopsy warranted.   HAND SURGERY Right    trigger finger   HERNIA REPAIR     umbilical hernia   KNEE SURGERY Right    arthroscopy x4   LYMPH NODE DISSECTION  2019   2 removed in left neck, "bonded together"   partial parathyroidectomy with lymph node dissection  2836   benign   UMBILICAL HERNIA REPAIR N/A 05/29/2020   Procedure: LAPAROSCOPIC UMBILICAL HERNIA REPAIR WITH MESH;  Surgeon: Jesusita Oka, MD;  Location: [facility];  Service: General;  Laterality: N/A;  START TIME OF 9:00 AM FOR 120 MINUTES IN DOW ROOM    Current Outpatient Medications  Medication Sig Dispense Refill   acetaminophen (TYLENOL) 325 MG tablet Take 650 mg by mouth every 6 (six) hours as needed for moderate pain.     chlorthalidone (HYGROTON) 25 MG tablet Take 25 mg by mouth every evening.   1   diltiazem (TIAZAC) 360 MG 24 hr capsule Take 360 mg by mouth every evening.   1   hydrALAZINE (APRESOLINE) 25 MG tablet Take 1 tablet (25 mg total) by mouth 2 (two) times daily. 180 tablet 1   metFORMIN (GLUCOPHAGE-XR) 500 MG 24 hr tablet Take 500 mg by mouth 2 (two) times daily.     omeprazole (PRILOSEC) 20 MG capsule Take 20 mg by mouth every evening.      ondansetron (ZOFRAN-ODT) 4 MG disintegrating tablet Take 1 tablet (4 mg total) by mouth every 6 (six) hours as needed for nausea. 20 tablet 0   propranolol ER (INDERAL LA) 120 MG 24 hr capsule TAKE ONE CAPSULE BY MOUTH DAILY (Patient taking differently: Take 120 mg by mouth daily.) 30 capsule 2   sertraline (ZOLOFT) 100 MG tablet Take 200 mg by mouth at bedtime.      TRULICITY 6.29  UT/6.5YY SOPN Inject 0.75 mg as directed every Monday.     No current facility-administered medications for this visit.   Allergies:  Toradol [ketorolac tromethamine]   Social History: The patient  reports that he has never smoked. His smokeless tobacco use includes snuff. He reports current alcohol use. He reports that he does not use drugs.   Family History: The patient's family history includes Colitis in his maternal aunt; Irritable bowel syndrome in his paternal grandmother and sister.   ROS:  Please see the history of present illness. Otherwise, complete review of systems is positive for none.  All other systems are reviewed and negative.   Physical Exam: VS:  There were no vitals taken for this visit., BMI There is no  height or weight on file to calculate BMI.  Wt Readings from Last 3 Encounters:  12/05/20 (!) 333 lb 9.6 oz (151.3 kg)  10/03/20 (!) 340 lb (154.2 kg)  09/27/20 (!) 340 lb (154.2 kg)    General: Morbidly obese patient appears comfortable at rest. Neck: Supple, no elevated JVP or carotid bruits, no thyromegaly. Lungs: Clear to auscultation, nonlabored breathing at rest. Cardiac: Regular rate and rhythm, no S3 or significant systolic murmur, no pericardial rub. Extremities: No pitting edema, distal pulses 2+. Skin: Warm and dry. Musculoskeletal: No kyphosis. Neuropsychiatric: Alert and oriented x3, affect grossly appropriate.  ECG: 12/05/2020 EKG normal sinus rhythm rate of 93.  Recent Labwork: 05/29/2020: BUN 13; Hemoglobin 14.0; Platelets 188; Potassium 3.9; Sodium 134 07/02/2020: Creatinine, Ser 0.60     Component Value Date/Time   CHOL 201 (H) 10/13/2017 0824   TRIG 414 (H) 10/13/2017 0824   HDL 33 (L) 10/13/2017 0824   CHOLHDL 6.1 (H) 10/13/2017 0824   LDLCALC Comment 10/13/2017 0824    Other Studies Reviewed Today:  Cardiac Monitor 01/02/2021 Normal sinus rhythm Single run of supraventricular tachycardia lasting only 4 beats.   No sustained  arrhythmias    Lexiscan myoview 12/12/2020   The study is normal. The study is low risk. There are no perfusion defects consistent with prior infarct or current ischemia.   No ST deviation was noted.   Defect 1: There is a medium defect with moderate reduction in uptake present in the apical to basal inferior and inferoseptal location(s). Defect is most intense in the resting images with normal wall motion, finding is consistent with diaphragmatic attenuation.   Left ventricular function is normal    Echocardiogram 10/02/2020  1. Strain may not be accurate due to tachycardia and poor septal tracking. Left ventricular ejection fraction, by estimation, is 60 to 65%. The left ventricle has normal function. The left ventricle has no regional wall motion abnormalities. Left ventricular diastolic parameters were normal. The average left ventricular global longitudinal strain is -10.5 %. The global longitudinal strain is abnormal. 2. Right ventricular systolic function is normal. The right ventricular size is normal. 3. The mitral valve is normal in structure. No evidence of mitral valve regurgitation. No evidence of mitral stenosis. 4. The aortic valve is normal in structure. Aortic valve regurgitation is not visualized. No aortic stenosis is present. 5. The inferior vena cava is normal in size with greater than 50% respiratory variability, suggesting right atrial pressure of 3 mmHg.  Assessment and Plan:  1. Tachycardia   2. Essential hypertension   3. Chest pain of uncertain etiology     1. Tachycardia/palpitations Heart rate is well controlled today at 73, however he states he has had a couple episodes of palpitations/racing heart with some associated dizziness.  EKG today shows normal sinus rhythm rate of 93.  Please get a 14-day ZIO monitor to assess for arrhythmias.  Continue diltiazem 360 mg daily.  2. Essential hypertension Blood pressure elevated today at 142/100.  Continue  propranolol ER 120 mg p.o. daily.  Continue chlorthalidone 25 mg p.o. daily.  Add hydralazine 25 mg p.o. twice daily.  Advised patient to check his blood pressures and let us know what they have been running over the next 2 weeks.  We may need to uptitrate hydralazine based on results.  3. Chest pain/chest pressure of uncertain etiology Complaining of chest pain/chest tightness with increased fatigue.  States chest tightness/pain comes on with exertion and relieved with rest.  Please get a  Lexiscan stress test.   Medication Adjustments/Labs and Tests Ordered: Current medicines are reviewed at length with the patient today.  Concerns regarding medicines are outlined above.   Disposition: Follow-up with Dr. Percival Spanish or [facility] 2-3 months.  Signed, Levell July, NP 02/09/2021 5:13 PM    [facility] [place], [place],  [address] Phone: [phone]; Fax: [phone]

## 2021-02-10 ENCOUNTER — Ambulatory Visit: Payer: No Typology Code available for payment source | Admitting: Family Medicine

## 2021-02-10 DIAGNOSIS — R079 Chest pain, unspecified: Secondary | ICD-10-CM

## 2021-02-10 DIAGNOSIS — R000 Tachycardia, unspecified: Secondary | ICD-10-CM

## 2021-02-10 DIAGNOSIS — I10 Essential (primary) hypertension: Secondary | ICD-10-CM

## 2022-07-30 ENCOUNTER — Encounter (HOSPITAL_COMMUNITY): Payer: Self-pay

## 2022-07-30 ENCOUNTER — Emergency Department (HOSPITAL_COMMUNITY): Payer: No Typology Code available for payment source

## 2022-07-30 ENCOUNTER — Other Ambulatory Visit (HOSPITAL_COMMUNITY): Payer: No Typology Code available for payment source

## 2022-07-30 ENCOUNTER — Other Ambulatory Visit: Payer: Self-pay

## 2022-07-30 ENCOUNTER — Emergency Department (HOSPITAL_COMMUNITY)
Admission: EM | Admit: 2022-07-30 | Discharge: 2022-07-30 | Disposition: A | Discharge status: Home / Self Care | Payer: No Typology Code available for payment source | Attending: Student | Admitting: Student

## 2022-07-30 DIAGNOSIS — I10 Essential (primary) hypertension: Secondary | ICD-10-CM | POA: Insufficient documentation

## 2022-07-30 DIAGNOSIS — E119 Type 2 diabetes mellitus without complications: Secondary | ICD-10-CM | POA: Insufficient documentation

## 2022-07-30 DIAGNOSIS — R112 Nausea with vomiting, unspecified: Secondary | ICD-10-CM | POA: Insufficient documentation

## 2022-07-30 DIAGNOSIS — Z7984 Long term (current) use of oral hypoglycemic drugs: Secondary | ICD-10-CM | POA: Diagnosis not present

## 2022-07-30 DIAGNOSIS — R1031 Right lower quadrant pain: Secondary | ICD-10-CM | POA: Insufficient documentation

## 2022-07-30 DIAGNOSIS — R110 Nausea: Secondary | ICD-10-CM

## 2022-07-30 DIAGNOSIS — Z79899 Other long term (current) drug therapy: Secondary | ICD-10-CM | POA: Insufficient documentation

## 2022-07-30 LAB — CBC WITH DIFFERENTIAL/PLATELET
Abs Immature Granulocytes: 0.02 10*3/uL (ref 0.00–0.07)
Basophils Absolute: 0.1 10*3/uL (ref 0.0–0.1)
Basophils Relative: 1 %
Eosinophils Absolute: 0.1 10*3/uL (ref 0.0–0.5)
Eosinophils Relative: 1 %
HCT: 43.4 % (ref 39.0–52.0)
Hemoglobin: 15.8 g/dL (ref 13.0–17.0)
Immature Granulocytes: 0 %
Lymphocytes Relative: 19 %
Lymphs Abs: 1.5 10*3/uL (ref 0.7–4.0)
MCH: 32.8 pg (ref 26.0–34.0)
MCHC: 36.4 g/dL — ABNORMAL HIGH (ref 30.0–36.0)
MCV: 90 fL (ref 80.0–100.0)
Monocytes Absolute: 0.5 10*3/uL (ref 0.1–1.0)
Monocytes Relative: 6 %
Neutro Abs: 5.8 10*3/uL (ref 1.7–7.7)
Neutrophils Relative %: 73 %
Platelets: 215 10*3/uL (ref 150–400)
RBC: 4.82 MIL/uL (ref 4.22–5.81)
RDW: 12.2 % (ref 11.5–15.5)
WBC: 7.9 10*3/uL (ref 4.0–10.5)
nRBC: 0 % (ref 0.0–0.2)

## 2022-07-30 LAB — URINALYSIS, ROUTINE W REFLEX MICROSCOPIC
Bacteria, UA: NONE SEEN
Bilirubin Urine: NEGATIVE
Glucose, UA: >=500 mg/dL — AB
Hgb urine dipstick: NEGATIVE
Ketones, ur: NEGATIVE mg/dL
Leukocytes,Ua: NEGATIVE
Nitrite: NEGATIVE
Protein, ur: 30 mg/dL — AB
Specific Gravity, Urine: 1.022 (ref 1.005–1.030)
pH: 5 (ref 5.0–8.0)

## 2022-07-30 LAB — COMPREHENSIVE METABOLIC PANEL
ALT: 20 U/L (ref 0–44)
AST: 24 U/L (ref 15–41)
Albumin: 4 g/dL (ref 3.5–5.0)
Alkaline Phosphatase: 80 U/L (ref 38–126)
Anion gap: 12 (ref 5–15)
BUN: 10 mg/dL (ref 6–20)
CO2: 23 mmol/L (ref 22–32)
Calcium: 8.6 mg/dL — ABNORMAL LOW (ref 8.9–10.3)
Chloride: 96 mmol/L — ABNORMAL LOW (ref 98–111)
Creatinine, Ser: 0.77 mg/dL (ref 0.61–1.24)
GFR, Estimated: >60 mL/min (ref >60)
Glucose, Bld: 251 mg/dL — ABNORMAL HIGH (ref 70–99)
Potassium: 3.7 mmol/L (ref 3.5–5.1)
Sodium: 131 mmol/L — ABNORMAL LOW (ref 135–145)
Total Bilirubin: 0.9 mg/dL (ref 0.3–1.2)
Total Protein: 7.5 g/dL (ref 6.5–8.1)

## 2022-07-30 LAB — LIPASE, BLOOD: Lipase: 32 U/L (ref 11–51)

## 2022-07-30 MED ORDER — ONDANSETRON HCL 4 MG/2ML IJ SOLN
4.0000 mg | Freq: Once | INTRAMUSCULAR | Status: AC
  Administered 2022-07-30: 4 mg via INTRAVENOUS
  Filled 2022-07-30: qty 2

## 2022-07-30 MED ORDER — MORPHINE SULFATE (PF) 4 MG/ML IV SOLN
4.0000 mg | Freq: Once | INTRAVENOUS | Status: AC
  Administered 2022-07-30: 4 mg via INTRAVENOUS
  Filled 2022-07-30: qty 1

## 2022-07-30 MED ORDER — LACTATED RINGERS IV BOLUS
1000.0000 mL | Freq: Once | INTRAVENOUS | Status: AC
  Administered 2022-07-30: 1000 mL via INTRAVENOUS

## 2022-07-30 MED ORDER — IOHEXOL 300 MG/ML  SOLN
100.0000 mL | Freq: Once | INTRAMUSCULAR | Status: AC | PRN
  Administered 2022-07-30: 100 mL via INTRAVENOUS

## 2022-07-30 NOTE — ED Triage Notes (Signed)
Pt c/o right sided abd pain. Pain started Sunday, stated he was seen at [facility] on Monday and they didn't seen anything wrong in his scans. His pain continued to get worse and his PCP recommended him to come here.

## 2022-07-30 NOTE — ED Provider Notes (Signed)
[place] EMERGENCY DEPARTMENT AT [facility] Provider Note  CSN: [id number] Arrival date & time: 07/30/22 1232  Chief Complaint(s) Abdominal Pain (Right side)  HPI Matthew Vance is a 42 y.o. male with PMH anxiety, depression, T2DM, HTN, nephrolithiasis who presents emergency room for evaluation of right lower quadrant pain.  Patient seen 3 days ago at [facility] with similar complaints with overall negative workup but symptoms have progressively worsened and patient was instructed to return to the emergency department if symptoms not improved.  Patient saw PCP who also told the patient to come to the ER for further evaluation.  Patient endorses nausea vomiting and persistent abdominal pain but states that he had a bowel movement at 10 AM this morning and has had no dysuria or hematuria.  Denies fever, chest pain, shortness of breath, headache or other systemic symptoms.   Past Medical History Past Medical History:  Diagnosis Date   Anxiety    Depression    Diabetes mellitus without complication    GERD (gastroesophageal reflux disease)    Hypertension    Migraine    Pre-diabetes    Renal disorder    kidney stone   Patient Active Problem List   Diagnosis Date Noted   Type 2 diabetes mellitus with complication, without long-term current use of insulin 09/26/2020   Essential hypertension 09/26/2020   Tachycardia 09/26/2020   Umbilical hernia 05/28/2020   Hepatic steatosis 09/07/2018   Melena 09/07/2018   Migraine with aura and without status migrainosus 10/13/2017   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 Authorizing Provider  acetaminophen (TYLENOL) 325 MG tablet Take 650 mg by mouth every 6 (six) hours as needed for moderate pain.   Yes [provider]  FARXIGA 10 MG TABS tablet Take 10 mg by mouth daily. 04/05/22  Yes [provider]  metoprolol succinate (TOPROL-XL) 25 MG 24 hr tablet Take 25 mg by mouth daily. 06/09/22  Yes [provider]  omeprazole (PRILOSEC) 20 MG capsule Take 20 mg by mouth every evening.    Yes [provider]  dicyclomine (BENTYL) 20 MG tablet Take 20 mg by mouth 3 (three) times daily.    [provider]  metFORMIN (GLUCOPHAGE-XR) 500 MG 24 hr tablet Take 1,000 mg by mouth 2 (two) times daily. 08/18/18   [provider]  ondansetron (ZOFRAN-ODT) 4 MG disintegrating tablet Take 1 tablet (4 mg total) by mouth every 6 (six) hours as needed for nausea. 05/29/20   Maczis, Elmer Sow, PA-C  oxyCODONE-acetaminophen (PERCOCET/ROXICET) 5-325 MG tablet Take by mouth. 07/28/22 08/02/22  [provider]  Past Surgical History Past Surgical History:  Procedure Laterality Date   AGILE CAPSULE N/A 09/20/2018   Procedure: AGILE CAPSULE;  Surgeon: Corbin Ade, MD;  Location: [facility];  Service: Endoscopy;  Laterality: N/A;  7:30am   BIOPSY  10/20/2018   Procedure: BIOPSY;  Surgeon: Corbin Ade, MD;  Location: [facility];  Service: Endoscopy;;  small bowel   CHOLECYSTECTOMY  06/14/15   Dr. Fraser Din: path: cholelithiasis with chronic cholecystitis   COLONOSCOPY  06/04/15   Dr. Jones Skene: normal   COLONOSCOPY WITH PROPOFOL N/A 09/26/2015   next tcs in 5 years for tubular adenoma. random colon bx negative. subtle muscosal changes of TI with negative bx, int hemorrhoids.     CYSTOSCOPY WITH RETROGRADE PYELOGRAM, URETEROSCOPY AND STENT PLACEMENT Right 11/03/2017   Procedure: CYSTOSCOPY WITH RIGHT RETROGRADE PYELOGRAM, URETEROSCOPY AND STONE EXTRACTION;  Surgeon: Malen Gauze, MD;  Location: [facility] OR[facility];  Service: Urology;  Laterality: Right;   ENTEROSCOPY N/A 10/20/2018   Procedure: ENTEROSCOPY WITH PROPOFOL;  Surgeon: Corbin Ade, MD;  Location: [facility];  Service: Endoscopy;  Laterality: N/A;  +/- limited push enteroscopy with the pediatric colonoscope    ESOPHAGOGASTRODUODENOSCOPY  06/27/15   Dr. Fraser Din: gastritis   ESOPHAGOGASTRODUODENOSCOPY (EGD) WITH PROPOFOL N/A 10/20/2018   Dr. Jena Gauss: Small hiatal hernia, multiple diminutive appearing gastric polyps not manipulated, duodenum, proximal jejunum status post multiple biopsies which were benign.  Abnormality seen on capsule in study question artifact versus not reached   GIVENS CAPSULE STUDY N/A 09/28/2018   Dr. Jena Gauss: Abnormal more proximal small bowel, images appear to depict for strain/fracture of the mucosa.  Enteroscopy with biopsy warranted.   HAND SURGERY Right    trigger finger   HERNIA REPAIR     umbilical hernia   KNEE SURGERY Right    arthroscopy x4   LYMPH NODE DISSECTION  2019   2 removed in left neck, "bonded together"   partial parathyroidectomy with lymph node dissection  2019   benign   UMBILICAL HERNIA REPAIR N/A 05/29/2020   Procedure: LAPAROSCOPIC UMBILICAL HERNIA REPAIR WITH MESH;  Surgeon: Diamantina Monks, MD;  Location: [facility] OR;  Service: General;  Laterality: N/A;  START TIME OF 9:00 AM FOR 120 MINUTES IN DOW ROOM   Family History Family History  Problem Relation Age of Onset   Irritable bowel syndrome Sister    Colitis Maternal Aunt        had colostomy   Irritable bowel syndrome Paternal Grandmother    Crohn's disease Neg Hx    Colon cancer Neg Hx    Celiac disease Neg Hx    Migraines Neg Hx     Social History Social History   Tobacco Use   Smoking status:  Never   Smokeless tobacco: Current    Types: Snuff  Vaping Use   Vaping Use: Never used  Substance Use Topics   Alcohol use: Yes    Alcohol/week: 0.0 standard drinks of alcohol    Comment: rare   Drug use: No   Allergies Toradol [ketorolac tromethamine]  Review of Systems Review of Systems  Gastrointestinal:  Positive for abdominal pain, nausea and vomiting.    Physical Exam Vital Signs  I have reviewed the triage vital signs BP (!) 155/90   Pulse 91   Temp 97.6 F (36.4 C) (Oral)   Resp 15   SpO2 99%   Physical Exam Constitutional:      General: He is not in acute  distress.    Appearance: Normal appearance.  HENT:     Head: Normocephalic and atraumatic.     Nose: No congestion or rhinorrhea.  Eyes:     General:        Right eye: No discharge.        Left eye: No discharge.     Extraocular Movements: Extraocular movements intact.     Pupils: Pupils are equal, round, and reactive to light.  Cardiovascular:     Rate and Rhythm: Normal rate and regular rhythm.     Heart sounds: No murmur heard. Pulmonary:     Effort: No respiratory distress.     Breath sounds: No wheezing or rales.  Abdominal:     General: There is no distension.     Tenderness: There is abdominal tenderness in the right lower quadrant.  Musculoskeletal:        General: Normal range of motion.     Cervical back: Normal range of motion.  Skin:    General: Skin is warm and dry.  Neurological:     General: No focal deficit present.     Mental Status: He is alert.     ED Results and Treatments Labs (all labs ordered are listed, but only abnormal results are displayed) Labs Reviewed  CBC WITH DIFFERENTIAL/PLATELET - Abnormal; Notable for the following components:      Result Value   MCHC 36.4 (*)    All other components within normal limits  COMPREHENSIVE METABOLIC PANEL - Abnormal; Notable for the following components:   Sodium 131 (*)    Chloride 96 (*)    Glucose, Bld 251 (*)     Calcium 8.6 (*)    All other components within normal limits  URINALYSIS, ROUTINE W REFLEX MICROSCOPIC - Abnormal; Notable for the following components:   Glucose, UA >=500 (*)    Protein, ur 30 (*)    All other components within normal limits  LIPASE, BLOOD                                                                                                                          Radiology CT ABDOMEN PELVIS W CONTRAST  Result Date: 07/30/2022 CLINICAL DATA:  Right lower quadrant pain. EXAM: CT ABDOMEN AND PELVIS WITH CONTRAST TECHNIQUE: Multidetector CT imaging of the abdomen and pelvis was performed using the standard protocol following bolus administration of intravenous contrast. RADIATION DOSE REDUCTION: This exam was performed according to the departmental dose-optimization program which includes automated exposure control, adjustment of the mA and/or kV according to patient size and/or use of iterative reconstruction technique. CONTRAST:  OMNIPAQUE IOHEXOL 300 MG/ML  SOLN COMPARISON:  July 02, 2020 FINDINGS: Lower chest: No acute abnormality. Hepatobiliary: No focal liver abnormality is seen. Status post cholecystectomy. No biliary dilatation. Pancreas: Unremarkable. No pancreatic ductal dilatation or surrounding inflammatory changes. Spleen: Normal in size without focal abnormality. Adrenals/Urinary Tract: Normal adrenal glands. No evidence of nephrolithiasis or obstructive uropathy. Left renal  cyst. 1.4 cm right lower pole circumscribed hypoechoic mass with density of 20 Hounsfield units. Normal urinary bladder. Stomach/Bowel: Stomach is within normal limits. Appendix appears normal. No evidence of bowel wall thickening, distention, or inflammatory changes. Vascular/Lymphatic: No significant vascular findings are present. No enlarged abdominal or pelvic lymph nodes. Reproductive: Prostate is unremarkable. Other: Wide neck fat containing periumbilical anterior abdominal wall hernia.  Musculoskeletal: No acute or significant osseous findings. IMPRESSION: 1. No acute abnormality identified within the abdomen or pelvis. 2. 1.4 cm right lower pole circumscribed hypoechoic renal mass with density of 20 Hounsfield units. This may represent a complex cyst, however low-density solid mass cannot be excluded. Further evaluation with renal ultrasound may be considered. 3. Wide neck fat containing periumbilical anterior abdominal wall hernia. Electronically Signed   By: Ted Mcalpine M.D.   On: 07/30/2022 15:52    Pertinent labs & imaging results that were available during my care of the patient were reviewed by me and considered in my medical decision making (see MDM for details).  Medications Ordered in ED Medications  morphine (PF) 4 MG/ML injection 4 mg (4 mg Intravenous Given 07/30/22 1324)  ondansetron (ZOFRAN) injection 4 mg (4 mg Intravenous Given 07/30/22 1323)  lactated ringers bolus 1,000 mL (0 mLs Intravenous Stopped 07/30/22 1522)  morphine (PF) 4 MG/ML injection 4 mg (4 mg Intravenous Given 07/30/22 1522)  iohexol (OMNIPAQUE) 300 MG/ML solution 100 mL (100 mLs Intravenous Contrast Given 07/30/22 1536)                                                                                                                                     Procedures Procedures  (including critical care time)  Medical Decision Making / ED Course   This patient presents to the ED for concern of abdominal pain, this involves an extensive number of treatment options, and is a complaint that carries with it a high risk of complications and morbidity.  The differential diagnosis includes appendicitis, nephrolithiasis, diverticulitis, epiploic appendagitis, inflammatory bowel disease, constipation, gastroenteritis,   MDM: Patient seen emergency room for evaluation of abdominal pain.  Physical exam with right lower quadrant tenderness to palpation but is overall mild with no rebound or guarding.   Laboratory evaluation largely unremarkable.  Urinalysis negative.  At time of signout, patient pending CT abdomen pelvis.  Please see provider signout for continuation of workup.   Additional history obtained: -Additional history obtained from the father -External records from outside source obtained and reviewed including: Chart review including previous notes, labs, imaging, consultation notes   Lab Tests: -I ordered, reviewed, and interpreted labs.   The pertinent results include:   Labs Reviewed  CBC WITH DIFFERENTIAL/PLATELET - Abnormal; Notable for the following components:      Result Value   MCHC 36.4 (*)    All other components within normal limits  COMPREHENSIVE METABOLIC PANEL - Abnormal; Notable for the following components:  Sodium 131 (*)    Chloride 96 (*)    Glucose, Bld 251 (*)    Calcium 8.6 (*)    All other components within normal limits  URINALYSIS, ROUTINE W REFLEX MICROSCOPIC - Abnormal; Notable for the following components:   Glucose, UA >=500 (*)    Protein, ur 30 (*)    All other components within normal limits  LIPASE, BLOOD       Imaging Studies ordered: I ordered imaging studies including CT abdomen pelvis and this is pending   Medicines ordered and prescription drug management: Meds ordered this encounter  Medications   morphine (PF) 4 MG/ML injection 4 mg   ondansetron (ZOFRAN) injection 4 mg   lactated ringers bolus 1,000 mL   morphine (PF) 4 MG/ML injection 4 mg   iohexol (OMNIPAQUE) 300 MG/ML solution 100 mL    -I have reviewed the patients home medicines and have made adjustments as needed  Critical interventions none    Cardiac Monitoring: The patient was maintained on a cardiac monitor.  I personally viewed and interpreted the cardiac monitored which showed an underlying rhythm of: NSR  Social Determinants of Health:  Factors impacting patients care include: none   Reevaluation: After the interventions noted above, I  reevaluated the patient and found that they have :improved  Co morbidities that complicate the patient evaluation  Past Medical History:  Diagnosis Date   Anxiety    Depression    Diabetes mellitus without complication    GERD (gastroesophageal reflux disease)    Hypertension    Migraine    Pre-diabetes    Renal disorder    kidney stone      Dispostion: I considered admission for this patient, and disposition pending completion of CT imaging.  Please see provider signout for continuation of workup.     Final Clinical Impression(s) / ED Diagnoses Final diagnoses:  None     @PCDICTATION @    Glendora ScoreKommor, Zlaty Alexa, MD 07/30/22 1658

## 2022-07-30 NOTE — ED Provider Notes (Signed)
  Physical Exam  BP (!) 168/103   Pulse 87   Temp 97.6 F (36.4 C) (Oral)   Resp 14   SpO2 99%   Physical Exam  Procedures  Procedures  ED Course / MDM   Clinical Course as of 07/30/22 1940  Thu Jul 30, 2022  1457 Assumed care from Dr Elias Else.  42 year old male with a history of ventral hernia who presented with RLQ pain.  Did have a CT scan recently that was reassuring but concerned that he may have had evolving appendicitis so they are repeating a CT scan today. [RP]  1940 CT scan today does not show any acute abnormalities.  Patient is feeling much better after being given pain and nausea medication.  Will discharge him home with PCP and GI follow-up and he reports that he already has a Zofran prescription. [RP]    Clinical Course User Index [RP] Rondel Baton, MD   Medical Decision Making Amount and/or Complexity of Data Reviewed Labs: ordered. Radiology: ordered.  Risk Prescription drug management.      Rondel Baton, MD 07/30/22 [phone]
